# Patient Record
Sex: Female | Born: 1945 | Race: White | Hispanic: No | Marital: Married | Smoking: Never smoker
Health system: Southern US, Community
[De-identification: ages and names within clinical notes are randomized; demographics above are authoritative.]

## PROBLEM LIST (undated history)

## (undated) DIAGNOSIS — E782 Mixed hyperlipidemia: Secondary | ICD-10-CM

## (undated) DIAGNOSIS — Z9889 Other specified postprocedural states: Secondary | ICD-10-CM

## (undated) DIAGNOSIS — E538 Deficiency of other specified B group vitamins: Secondary | ICD-10-CM

## (undated) DIAGNOSIS — K6389 Other specified diseases of intestine: Secondary | ICD-10-CM

## (undated) DIAGNOSIS — C50919 Malignant neoplasm of unspecified site of unspecified female breast: Secondary | ICD-10-CM

## (undated) DIAGNOSIS — Z923 Personal history of irradiation: Secondary | ICD-10-CM

## (undated) DIAGNOSIS — R911 Solitary pulmonary nodule: Secondary | ICD-10-CM

## (undated) DIAGNOSIS — C50912 Malignant neoplasm of unspecified site of left female breast: Secondary | ICD-10-CM

## (undated) DIAGNOSIS — E039 Hypothyroidism, unspecified: Secondary | ICD-10-CM

## (undated) HISTORY — DX: Malignant neoplasm of unspecified site of left female breast: C50.912

## (undated) HISTORY — PX: WISDOM TOOTH EXTRACTION: SHX21

## (undated) HISTORY — PX: TUBAL LIGATION: SHX77

## (undated) HISTORY — PX: MASTECTOMY: SHX3

---

## 2004-06-09 ENCOUNTER — Ambulatory Visit: Payer: Self-pay | Admitting: Internal Medicine

## 2005-07-06 ENCOUNTER — Ambulatory Visit: Payer: Self-pay | Admitting: Internal Medicine

## 2005-08-22 ENCOUNTER — Ambulatory Visit: Payer: Self-pay | Admitting: Gastroenterology

## 2006-07-10 ENCOUNTER — Ambulatory Visit: Payer: Self-pay | Admitting: Internal Medicine

## 2007-07-16 ENCOUNTER — Ambulatory Visit: Payer: Self-pay | Admitting: Internal Medicine

## 2008-07-23 ENCOUNTER — Ambulatory Visit: Payer: Self-pay | Admitting: Internal Medicine

## 2009-08-11 ENCOUNTER — Ambulatory Visit: Payer: Self-pay | Admitting: Internal Medicine

## 2010-09-28 ENCOUNTER — Ambulatory Visit: Payer: Self-pay | Admitting: Internal Medicine

## 2010-10-11 ENCOUNTER — Ambulatory Visit: Payer: Self-pay | Admitting: Internal Medicine

## 2011-02-07 DIAGNOSIS — Z9221 Personal history of antineoplastic chemotherapy: Secondary | ICD-10-CM

## 2011-02-07 DIAGNOSIS — C50919 Malignant neoplasm of unspecified site of unspecified female breast: Secondary | ICD-10-CM

## 2011-02-07 DIAGNOSIS — Z923 Personal history of irradiation: Secondary | ICD-10-CM

## 2011-02-07 HISTORY — DX: Personal history of irradiation: Z92.3

## 2011-02-07 HISTORY — PX: BREAST EXCISIONAL BIOPSY: SUR124

## 2011-02-07 HISTORY — PX: BREAST LUMPECTOMY: SHX2

## 2011-02-07 HISTORY — DX: Personal history of antineoplastic chemotherapy: Z92.21

## 2011-02-07 HISTORY — DX: Malignant neoplasm of unspecified site of unspecified female breast: C50.919

## 2011-04-13 ENCOUNTER — Ambulatory Visit: Payer: Self-pay | Admitting: Internal Medicine

## 2011-05-02 ENCOUNTER — Ambulatory Visit: Payer: Self-pay | Admitting: Surgery

## 2011-05-10 ENCOUNTER — Ambulatory Visit: Payer: Self-pay | Admitting: Surgery

## 2011-05-16 LAB — PATHOLOGY REPORT

## 2011-05-24 ENCOUNTER — Ambulatory Visit: Payer: Self-pay | Admitting: Oncology

## 2011-06-07 ENCOUNTER — Ambulatory Visit: Payer: Self-pay | Admitting: Oncology

## 2011-06-21 ENCOUNTER — Ambulatory Visit: Payer: Self-pay | Admitting: Surgery

## 2011-06-29 LAB — CBC CANCER CENTER
Basophil #: 0 x10 3/mm (ref 0.0–0.1)
Eosinophil #: 0.1 x10 3/mm (ref 0.0–0.7)
HCT: 37.7 % (ref 35.0–47.0)
HGB: 12.7 g/dL (ref 12.0–16.0)
Lymphocyte #: 1.3 x10 3/mm (ref 1.0–3.6)
MCHC: 33.5 g/dL (ref 32.0–36.0)
MCV: 90 fL (ref 80–100)
Monocyte #: 0.5 x10 3/mm (ref 0.2–0.9)
Monocyte %: 11.4 %
Neutrophil #: 2.8 x10 3/mm (ref 1.4–6.5)
Neutrophil %: 57.9 %
Platelet: 174 x10 3/mm (ref 150–440)
RBC: 4.2 10*6/uL (ref 3.80–5.20)
RDW: 12.8 % (ref 11.5–14.5)
WBC: 4.8 x10 3/mm (ref 3.6–11.0)

## 2011-06-29 LAB — COMPREHENSIVE METABOLIC PANEL
Albumin: 3.7 g/dL (ref 3.4–5.0)
Alkaline Phosphatase: 92 U/L (ref 50–136)
Chloride: 104 mmol/L (ref 98–107)
Creatinine: 0.9 mg/dL (ref 0.60–1.30)
Potassium: 4.2 mmol/L (ref 3.5–5.1)
SGPT (ALT): 35 U/L
Sodium: 142 mmol/L (ref 136–145)
Total Protein: 6.9 g/dL (ref 6.4–8.2)

## 2011-06-30 LAB — CBC WITH DIFFERENTIAL/PLATELET
Basophil %: 0.3 %
Eosinophil #: 0 10*3/uL (ref 0.0–0.7)
HCT: 34.1 % — ABNORMAL LOW (ref 35.0–47.0)
Lymphocyte #: 0.3 10*3/uL — ABNORMAL LOW (ref 1.0–3.6)
Lymphocyte %: 5.3 %
MCH: 30.5 pg (ref 26.0–34.0)
MCV: 89 fL (ref 80–100)
Monocyte %: 1.4 %
Neutrophil #: 5 10*3/uL (ref 1.4–6.5)
Neutrophil %: 92.7 %
RBC: 3.83 10*6/uL (ref 3.80–5.20)
RDW: 12.5 % (ref 11.5–14.5)
WBC: 5.4 10*3/uL (ref 3.6–11.0)

## 2011-07-01 ENCOUNTER — Inpatient Hospital Stay: Payer: Self-pay | Admitting: Internal Medicine

## 2011-07-01 LAB — COMPREHENSIVE METABOLIC PANEL
Alkaline Phosphatase: 84 U/L (ref 50–136)
Bilirubin,Total: 0.7 mg/dL (ref 0.2–1.0)
Calcium, Total: 7.8 mg/dL — ABNORMAL LOW (ref 8.5–10.1)
Chloride: 105 mmol/L (ref 98–107)
Creatinine: 1.03 mg/dL (ref 0.60–1.30)
EGFR (African American): >60
EGFR (Non-African Amer.): 57 — ABNORMAL LOW
Osmolality: 281 (ref 275–301)
Potassium: 3.8 mmol/L (ref 3.5–5.1)
Sodium: 140 mmol/L (ref 136–145)
Total Protein: 6.3 g/dL — ABNORMAL LOW (ref 6.4–8.2)

## 2011-07-01 LAB — URINALYSIS, COMPLETE
Bacteria: NONE SEEN
Blood: NEGATIVE
Glucose,UR: NEGATIVE mg/dL (ref 0–75)
Ketone: NEGATIVE
Ph: 5 (ref 4.5–8.0)
Protein: NEGATIVE
RBC,UR: 1 /HPF (ref 0–5)
Squamous Epithelial: <1

## 2011-07-01 LAB — TROPONIN I: Troponin-I: <0.02 ng/mL

## 2011-07-02 LAB — COMPREHENSIVE METABOLIC PANEL
Albumin: 2.9 g/dL — ABNORMAL LOW (ref 3.4–5.0)
Anion Gap: 8 (ref 7–16)
Bilirubin,Total: 1.4 mg/dL — ABNORMAL HIGH (ref 0.2–1.0)
Co2: 27 mmol/L (ref 21–32)
Creatinine: 0.96 mg/dL (ref 0.60–1.30)
EGFR (African American): >60
EGFR (Non-African Amer.): >60
Glucose: 121 mg/dL — ABNORMAL HIGH (ref 65–99)

## 2011-07-02 LAB — PROTIME-INR: INR: 1

## 2011-07-02 LAB — CBC WITH DIFFERENTIAL/PLATELET
Basophil #: 0 10*3/uL (ref 0.0–0.1)
Eosinophil %: 2.3 %
HCT: 34 % — ABNORMAL LOW (ref 35.0–47.0)
Lymphocyte #: 0.3 10*3/uL — ABNORMAL LOW (ref 1.0–3.6)
Lymphocyte %: 10.3 %
MCHC: 34 g/dL (ref 32.0–36.0)
Monocyte #: 0 x10 3/mm — ABNORMAL LOW (ref 0.2–0.9)
Neutrophil %: 86.3 %
Platelet: 81 10*3/uL — ABNORMAL LOW (ref 150–440)

## 2011-07-03 LAB — CBC WITH DIFFERENTIAL/PLATELET
Basophil #: 0 10*3/uL (ref 0.0–0.1)
Basophil %: 0.9 %
Eosinophil #: 0.1 10*3/uL (ref 0.0–0.7)
Eosinophil %: 2.6 %
HGB: 10.8 g/dL — ABNORMAL LOW (ref 12.0–16.0)
Lymphocyte %: 13.8 %
MCH: 30.4 pg (ref 26.0–34.0)
MCHC: 34.3 g/dL (ref 32.0–36.0)
Monocyte #: 0 x10 3/mm — ABNORMAL LOW (ref 0.2–0.9)
Neutrophil #: 1.8 10*3/uL (ref 1.4–6.5)
Platelet: 57 10*3/uL — ABNORMAL LOW (ref 150–440)
WBC: 2.2 10*3/uL — ABNORMAL LOW (ref 3.6–11.0)

## 2011-07-03 LAB — COMPREHENSIVE METABOLIC PANEL
Albumin: 2.6 g/dL — ABNORMAL LOW (ref 3.4–5.0)
Alkaline Phosphatase: 105 U/L (ref 50–136)
BUN: 9 mg/dL (ref 7–18)
Calcium, Total: 7.5 mg/dL — ABNORMAL LOW (ref 8.5–10.1)
Chloride: 106 mmol/L (ref 98–107)
Creatinine: 0.85 mg/dL (ref 0.60–1.30)
EGFR (African American): >60
EGFR (Non-African Amer.): >60
Potassium: 3.7 mmol/L (ref 3.5–5.1)
Sodium: 139 mmol/L (ref 136–145)

## 2011-07-04 LAB — CBC WITH DIFFERENTIAL/PLATELET
Basophil #: 0 10*3/uL (ref 0.0–0.1)
Eosinophil #: 0 10*3/uL (ref 0.0–0.7)
Eosinophil %: 1 %
HCT: 32.6 % — ABNORMAL LOW (ref 35.0–47.0)
HGB: 11.3 g/dL — ABNORMAL LOW (ref 12.0–16.0)
Lymphocyte #: 0.2 10*3/uL — ABNORMAL LOW (ref 1.0–3.6)
Lymphocyte %: 12.8 %
MCH: 30.2 pg (ref 26.0–34.0)
Monocyte #: 0 x10 3/mm — ABNORMAL LOW (ref 0.2–0.9)
Monocyte %: 1.5 %
Neutrophil %: 83.5 %
Platelet: 60 10*3/uL — ABNORMAL LOW (ref 150–440)
RDW: 12.4 % (ref 11.5–14.5)

## 2011-07-04 LAB — COMPREHENSIVE METABOLIC PANEL
BUN: 11 mg/dL (ref 7–18)
Bilirubin,Total: 0.9 mg/dL (ref 0.2–1.0)
Chloride: 99 mmol/L (ref 98–107)
EGFR (Non-African Amer.): >60
Glucose: 101 mg/dL — ABNORMAL HIGH (ref 65–99)
Osmolality: 270 (ref 275–301)
Potassium: 3.3 mmol/L — ABNORMAL LOW (ref 3.5–5.1)
Sodium: 135 mmol/L — ABNORMAL LOW (ref 136–145)
Total Protein: 6.1 g/dL — ABNORMAL LOW (ref 6.4–8.2)

## 2011-07-05 LAB — CBC WITH DIFFERENTIAL/PLATELET
Basophil #: 0 10*3/uL (ref 0.0–0.1)
HCT: 32.7 % — ABNORMAL LOW (ref 35.0–47.0)
HGB: 11.3 g/dL — ABNORMAL LOW (ref 12.0–16.0)
Lymphocyte #: 0.3 10*3/uL — ABNORMAL LOW (ref 1.0–3.6)
MCH: 30 pg (ref 26.0–34.0)
MCHC: 34.5 g/dL (ref 32.0–36.0)
Neutrophil #: 0.6 10*3/uL — ABNORMAL LOW (ref 1.4–6.5)
Neutrophil %: 59.9 %
Platelet: 65 10*3/uL — ABNORMAL LOW (ref 150–440)
RBC: 3.76 10*6/uL — ABNORMAL LOW (ref 3.80–5.20)
WBC: 1 10*3/uL — CL (ref 3.6–11.0)

## 2011-07-05 LAB — COMPREHENSIVE METABOLIC PANEL
Albumin: 2.7 g/dL — ABNORMAL LOW (ref 3.4–5.0)
Co2: 28 mmol/L (ref 21–32)
EGFR (African American): >60
EGFR (Non-African Amer.): >60
SGOT(AST): 111 U/L — ABNORMAL HIGH (ref 15–37)
Total Protein: 6.2 g/dL — ABNORMAL LOW (ref 6.4–8.2)

## 2011-07-06 LAB — COMPREHENSIVE METABOLIC PANEL
Albumin: 2.9 g/dL — ABNORMAL LOW (ref 3.4–5.0)
Anion Gap: 12 (ref 7–16)
BUN: 13 mg/dL (ref 7–18)
Bilirubin,Total: 0.9 mg/dL (ref 0.2–1.0)
Calcium, Total: 8.5 mg/dL (ref 8.5–10.1)
Chloride: 98 mmol/L (ref 98–107)
Co2: 27 mmol/L (ref 21–32)
Creatinine: 0.78 mg/dL (ref 0.60–1.30)
EGFR (Non-African Amer.): >60
Potassium: 3.5 mmol/L (ref 3.5–5.1)
SGOT(AST): 62 U/L — ABNORMAL HIGH (ref 15–37)
SGPT (ALT): 119 U/L — ABNORMAL HIGH

## 2011-07-06 LAB — CULTURE, BLOOD (SINGLE)

## 2011-07-06 LAB — CBC WITH DIFFERENTIAL/PLATELET
Eosinophil #: 0 10*3/uL (ref 0.0–0.7)
Eosinophil %: 1.1 %
HGB: 11.3 g/dL — ABNORMAL LOW (ref 12.0–16.0)
MCH: 30.3 pg (ref 26.0–34.0)
Monocyte #: 0.5 x10 3/mm (ref 0.2–0.9)
Neutrophil #: 0.7 10*3/uL — ABNORMAL LOW (ref 1.4–6.5)
WBC: 1.7 10*3/uL — CL (ref 3.6–11.0)

## 2011-07-06 LAB — IRON AND TIBC: Iron Saturation: 38 %

## 2011-07-06 LAB — FERRITIN: Ferritin (ARMC): 1035 ng/mL — ABNORMAL HIGH (ref 8–388)

## 2011-07-07 LAB — CBC WITH DIFFERENTIAL/PLATELET
HGB: 10.9 g/dL — ABNORMAL LOW (ref 12.0–16.0)
Lymphocytes: 15 %
MCHC: 34.7 g/dL (ref 32.0–36.0)
Metamyelocyte: 10 %
Monocytes: 11 %
Myelocyte: 14 %
Platelet: 112 10*3/uL — ABNORMAL LOW (ref 150–440)
RBC: 3.57 10*6/uL — ABNORMAL LOW (ref 3.80–5.20)
RDW: 12.4 % (ref 11.5–14.5)
Segmented Neutrophils: 13 %
WBC: 9.8 10*3/uL (ref 3.6–11.0)

## 2011-07-07 LAB — COMPREHENSIVE METABOLIC PANEL
Albumin: 2.9 g/dL — ABNORMAL LOW (ref 3.4–5.0)
Alkaline Phosphatase: 184 U/L — ABNORMAL HIGH (ref 50–136)
Anion Gap: 11 (ref 7–16)
BUN: 14 mg/dL (ref 7–18)
Bilirubin,Total: 0.7 mg/dL (ref 0.2–1.0)
Chloride: 101 mmol/L (ref 98–107)
Creatinine: 0.88 mg/dL (ref 0.60–1.30)
EGFR (African American): >60
EGFR (Non-African Amer.): >60
Glucose: 103 mg/dL — ABNORMAL HIGH (ref 65–99)
Potassium: 4.2 mmol/L (ref 3.5–5.1)
SGOT(AST): 43 U/L — ABNORMAL HIGH (ref 15–37)
Sodium: 139 mmol/L (ref 136–145)

## 2011-07-07 LAB — CULTURE, BLOOD (SINGLE)

## 2011-07-08 ENCOUNTER — Ambulatory Visit: Payer: Self-pay | Admitting: Oncology

## 2011-07-08 LAB — CULTURE, BLOOD (SINGLE)

## 2011-07-10 LAB — COMPREHENSIVE METABOLIC PANEL
Albumin: 3.3 g/dL — ABNORMAL LOW (ref 3.4–5.0)
Alkaline Phosphatase: 146 U/L — ABNORMAL HIGH (ref 50–136)
Anion Gap: 9 (ref 7–16)
BUN: 23 mg/dL — ABNORMAL HIGH (ref 7–18)
Calcium, Total: 8.9 mg/dL (ref 8.5–10.1)
Chloride: 100 mmol/L (ref 98–107)
Co2: 27 mmol/L (ref 21–32)
EGFR (African American): >60
EGFR (Non-African Amer.): 57 — ABNORMAL LOW
Osmolality: 278 (ref 275–301)
Potassium: 4.2 mmol/L (ref 3.5–5.1)
SGOT(AST): 31 U/L (ref 15–37)
SGPT (ALT): 69 U/L
Sodium: 136 mmol/L (ref 136–145)
Total Protein: 6.6 g/dL (ref 6.4–8.2)

## 2011-07-10 LAB — CBC CANCER CENTER
Eosinophil #: 0 x10 3/mm (ref 0.0–0.7)
Eosinophil %: 0 %
HGB: 11.3 g/dL — ABNORMAL LOW (ref 12.0–16.0)
Lymphocyte #: 1.6 x10 3/mm (ref 1.0–3.6)
MCHC: 33.1 g/dL (ref 32.0–36.0)
Monocyte %: 3.7 %
Neutrophil #: 15.6 x10 3/mm — ABNORMAL HIGH (ref 1.4–6.5)
Neutrophil %: 86.9 %
Platelet: 199 x10 3/mm (ref 150–440)
RBC: 3.8 10*6/uL (ref 3.80–5.20)
RDW: 12.8 % (ref 11.5–14.5)
WBC: 18 x10 3/mm — ABNORMAL HIGH (ref 3.6–11.0)

## 2011-07-11 LAB — CULTURE, BLOOD (SINGLE)

## 2011-07-20 LAB — CBC CANCER CENTER
Eosinophil #: 0 x10 3/mm (ref 0.0–0.7)
HGB: 10.7 g/dL — ABNORMAL LOW (ref 12.0–16.0)
MCV: 92 fL (ref 80–100)
Neutrophil %: 61.6 %
Platelet: 189 x10 3/mm (ref 150–440)
RDW: 13.4 % (ref 11.5–14.5)

## 2011-07-20 LAB — COMPREHENSIVE METABOLIC PANEL
Alkaline Phosphatase: 99 U/L (ref 50–136)
BUN: 14 mg/dL (ref 7–18)
Bilirubin,Total: 0.5 mg/dL (ref 0.2–1.0)
Calcium, Total: 8.3 mg/dL — ABNORMAL LOW (ref 8.5–10.1)
Chloride: 107 mmol/L (ref 98–107)
Co2: 28 mmol/L (ref 21–32)
Creatinine: 0.89 mg/dL (ref 0.60–1.30)
EGFR (African American): >60
EGFR (Non-African Amer.): >60
Glucose: 108 mg/dL — ABNORMAL HIGH (ref 65–99)
Osmolality: 282 (ref 275–301)
Potassium: 4 mmol/L (ref 3.5–5.1)
SGPT (ALT): 46 U/L
Sodium: 141 mmol/L (ref 136–145)

## 2011-07-27 LAB — CBC CANCER CENTER
Eosinophil %: 8.8 %
HCT: 30.2 % — ABNORMAL LOW (ref 35.0–47.0)
Lymphocyte #: 0.5 x10 3/mm — ABNORMAL LOW (ref 1.0–3.6)
Lymphocyte %: 85.1 %
MCH: 31.2 pg (ref 26.0–34.0)
MCHC: 33.7 g/dL (ref 32.0–36.0)
MCV: 93 fL (ref 80–100)
Monocyte %: 3.6 %
Neutrophil #: 0 x10 3/mm — ABNORMAL LOW (ref 1.4–6.5)
Neutrophil %: 1.2 %
Platelet: 72 x10 3/mm — ABNORMAL LOW (ref 150–440)
RBC: 3.26 10*6/uL — ABNORMAL LOW (ref 3.80–5.20)
RDW: 13.3 % (ref 11.5–14.5)
WBC: 0.5 x10 3/mm — CL (ref 3.6–11.0)

## 2011-08-03 LAB — CBC CANCER CENTER
Basophil #: 0 x10 3/mm (ref 0.0–0.1)
Eosinophil #: 0 x10 3/mm (ref 0.0–0.7)
Lymphocyte #: 1.9 x10 3/mm (ref 1.0–3.6)
Lymphocyte %: 14.6 %
MCH: 30.4 pg (ref 26.0–34.0)
MCHC: 33.1 g/dL (ref 32.0–36.0)
Monocyte #: 1.1 x10 3/mm — ABNORMAL HIGH (ref 0.2–0.9)
Platelet: 292 x10 3/mm (ref 150–440)
RDW: 13.8 % (ref 11.5–14.5)
WBC: 12.8 x10 3/mm — ABNORMAL HIGH (ref 3.6–11.0)

## 2011-08-07 ENCOUNTER — Ambulatory Visit: Payer: Self-pay | Admitting: Oncology

## 2011-08-09 LAB — CBC CANCER CENTER
Basophil %: 0.2 %
Eosinophil %: 0.1 %
HCT: 29.9 % — ABNORMAL LOW (ref 35.0–47.0)
HGB: 10 g/dL — ABNORMAL LOW (ref 12.0–16.0)
Lymphocyte #: 1.5 x10 3/mm (ref 1.0–3.6)
Lymphocyte %: 18.8 %
MCHC: 33.6 g/dL (ref 32.0–36.0)
Monocyte %: 12.3 %
Neutrophil #: 5.5 x10 3/mm (ref 1.4–6.5)
Platelet: 407 x10 3/mm (ref 150–440)
RDW: 14.5 % (ref 11.5–14.5)
WBC: 8.1 x10 3/mm (ref 3.6–11.0)

## 2011-08-09 LAB — BASIC METABOLIC PANEL
BUN: 13 mg/dL (ref 7–18)
Co2: 27 mmol/L (ref 21–32)
EGFR (Non-African Amer.): >60
Glucose: 125 mg/dL — ABNORMAL HIGH (ref 65–99)
Osmolality: 285 (ref 275–301)
Potassium: 3.9 mmol/L (ref 3.5–5.1)
Sodium: 142 mmol/L (ref 136–145)

## 2011-08-16 LAB — CBC CANCER CENTER
Basophil #: 0 x10 3/mm (ref 0.0–0.1)
Eosinophil #: 0 x10 3/mm (ref 0.0–0.7)
Eosinophil %: 0.4 %
Lymphocyte #: 0.5 x10 3/mm — ABNORMAL LOW (ref 1.0–3.6)
Lymphocyte %: 63.2 %
MCV: 93 fL (ref 80–100)
Monocyte %: 2.8 %
Neutrophil %: 31.9 %
Platelet: 207 x10 3/mm (ref 150–440)
RBC: 3.35 10*6/uL — ABNORMAL LOW (ref 3.80–5.20)
RDW: 15.1 % — ABNORMAL HIGH (ref 11.5–14.5)
WBC: 0.8 x10 3/mm — CL (ref 3.6–11.0)

## 2011-08-23 LAB — CBC CANCER CENTER
Basophil #: 0 x10 3/mm (ref 0.0–0.1)
Basophil %: 0.2 %
Eosinophil #: 0 x10 3/mm (ref 0.0–0.7)
Eosinophil %: 0.2 %
HGB: 9.6 g/dL — ABNORMAL LOW (ref 12.0–16.0)
Lymphocyte %: 13.1 %
MCH: 30.4 pg (ref 26.0–34.0)
MCV: 92 fL (ref 80–100)
Monocyte #: 1.2 x10 3/mm — ABNORMAL HIGH (ref 0.2–0.9)
Neutrophil %: 77.1 %
Platelet: 108 x10 3/mm — ABNORMAL LOW (ref 150–440)
RBC: 3.14 10*6/uL — ABNORMAL LOW (ref 3.80–5.20)

## 2011-08-30 LAB — CBC CANCER CENTER
Basophil #: 0 x10 3/mm (ref 0.0–0.1)
Basophil %: 0.5 %
Eosinophil %: 0.2 %
HGB: 9.9 g/dL — ABNORMAL LOW (ref 12.0–16.0)
Lymphocyte #: 0.9 x10 3/mm — ABNORMAL LOW (ref 1.0–3.6)
Lymphocyte %: 16.5 %
Monocyte #: 0.8 x10 3/mm (ref 0.2–0.9)
Monocyte %: 14.3 %
Neutrophil %: 68.5 %
RBC: 3.15 10*6/uL — ABNORMAL LOW (ref 3.80–5.20)
WBC: 5.7 x10 3/mm (ref 3.6–11.0)

## 2011-08-30 LAB — COMPREHENSIVE METABOLIC PANEL
Alkaline Phosphatase: 86 U/L (ref 50–136)
Anion Gap: 7 (ref 7–16)
Calcium, Total: 9 mg/dL (ref 8.5–10.1)
Chloride: 106 mmol/L (ref 98–107)
Co2: 28 mmol/L (ref 21–32)
Creatinine: 0.73 mg/dL (ref 0.60–1.30)
EGFR (African American): >60
Glucose: 112 mg/dL — ABNORMAL HIGH (ref 65–99)
Osmolality: 282 (ref 275–301)
SGOT(AST): 14 U/L — ABNORMAL LOW (ref 15–37)
SGPT (ALT): 21 U/L

## 2011-09-06 LAB — CBC CANCER CENTER
Basophil #: 0 x10 3/mm (ref 0.0–0.1)
Basophil %: 1 %
Eosinophil #: 0 x10 3/mm (ref 0.0–0.7)
Eosinophil %: 0.3 %
HCT: 29 % — ABNORMAL LOW (ref 35.0–47.0)
HGB: 9.8 g/dL — ABNORMAL LOW (ref 12.0–16.0)
Lymphocyte #: 0.4 x10 3/mm — ABNORMAL LOW (ref 1.0–3.6)
Lymphocyte %: 29.7 %
MCV: 95 fL (ref 80–100)
Monocyte %: 3.4 %
Neutrophil #: 0.9 x10 3/mm — ABNORMAL LOW (ref 1.4–6.5)
RBC: 3.03 10*6/uL — ABNORMAL LOW (ref 3.80–5.20)
WBC: 1.4 x10 3/mm — CL (ref 3.6–11.0)

## 2011-09-07 ENCOUNTER — Ambulatory Visit: Payer: Self-pay | Admitting: Oncology

## 2011-09-13 LAB — CBC CANCER CENTER
Basophil #: 0 x10 3/mm (ref 0.0–0.1)
Eosinophil #: 0 x10 3/mm (ref 0.0–0.7)
Eosinophil %: 0.7 %
Lymphocyte #: 1.2 x10 3/mm (ref 1.0–3.6)
Lymphocyte %: 19.7 %
MCH: 33.4 pg (ref 26.0–34.0)
MCHC: 35.1 g/dL (ref 32.0–36.0)
MCV: 95 fL (ref 80–100)
Monocyte #: 0.9 x10 3/mm (ref 0.2–0.9)
Neutrophil %: 63.7 %
Platelet: 112 x10 3/mm — ABNORMAL LOW (ref 150–440)
RBC: 2.85 10*6/uL — ABNORMAL LOW (ref 3.80–5.20)
RDW: 17 % — ABNORMAL HIGH (ref 11.5–14.5)
WBC: 6 x10 3/mm (ref 3.6–11.0)

## 2011-09-20 LAB — CBC CANCER CENTER
Basophil %: 0.6 %
HCT: 26.3 % — ABNORMAL LOW (ref 35.0–47.0)
Lymphocyte #: 0.8 x10 3/mm — ABNORMAL LOW (ref 1.0–3.6)
MCHC: 35.5 g/dL (ref 32.0–36.0)
MCV: 96 fL (ref 80–100)
Monocyte #: 0.6 x10 3/mm (ref 0.2–0.9)
Monocyte %: 15.4 %
Neutrophil #: 2.4 x10 3/mm (ref 1.4–6.5)
RDW: 18.9 % — ABNORMAL HIGH (ref 11.5–14.5)

## 2011-10-04 LAB — CBC CANCER CENTER
Basophil #: 0 x10 3/mm (ref 0.0–0.1)
Basophil %: 0.4 %
HCT: 30.5 % — ABNORMAL LOW (ref 35.0–47.0)
Lymphocyte #: 0.7 x10 3/mm — ABNORMAL LOW (ref 1.0–3.6)
Lymphocyte %: 21.4 %
MCH: 33.3 pg (ref 26.0–34.0)
MCV: 100 fL (ref 80–100)
Monocyte #: 0.6 x10 3/mm (ref 0.2–0.9)
Monocyte %: 18 %
Neutrophil #: 1.9 x10 3/mm (ref 1.4–6.5)
RBC: 3.06 10*6/uL — ABNORMAL LOW (ref 3.80–5.20)
RDW: 17 % — ABNORMAL HIGH (ref 11.5–14.5)

## 2011-10-08 ENCOUNTER — Ambulatory Visit: Payer: Self-pay | Admitting: Oncology

## 2011-10-11 LAB — CBC CANCER CENTER
Basophil %: 0.6 %
Eosinophil #: 0.2 x10 3/mm (ref 0.0–0.7)
Eosinophil %: 4.7 %
HCT: 31.6 % — ABNORMAL LOW (ref 35.0–47.0)
HGB: 10.6 g/dL — ABNORMAL LOW (ref 12.0–16.0)
Lymphocyte #: 0.7 x10 3/mm — ABNORMAL LOW (ref 1.0–3.6)
MCH: 33.8 pg (ref 26.0–34.0)
MCV: 101 fL — ABNORMAL HIGH (ref 80–100)
Monocyte #: 0.5 x10 3/mm (ref 0.2–0.9)
Neutrophil #: 2 x10 3/mm (ref 1.4–6.5)
RBC: 3.14 10*6/uL — ABNORMAL LOW (ref 3.80–5.20)

## 2011-10-18 LAB — CBC CANCER CENTER
Basophil #: 0 x10 3/mm (ref 0.0–0.1)
Eosinophil %: 5.2 %
HGB: 11 g/dL — ABNORMAL LOW (ref 12.0–16.0)
Lymphocyte %: 16.3 %
MCV: 101 fL — ABNORMAL HIGH (ref 80–100)
Monocyte %: 12.6 %
Neutrophil %: 65.4 %
Platelet: 151 x10 3/mm (ref 150–440)
RBC: 3.32 10*6/uL — ABNORMAL LOW (ref 3.80–5.20)
RDW: 14.7 % — ABNORMAL HIGH (ref 11.5–14.5)

## 2011-10-25 LAB — CBC CANCER CENTER
Basophil #: 0 x10 3/mm (ref 0.0–0.1)
Eosinophil #: 0.2 x10 3/mm (ref 0.0–0.7)
HCT: 34.1 % — ABNORMAL LOW (ref 35.0–47.0)
HGB: 11.4 g/dL — ABNORMAL LOW (ref 12.0–16.0)
Lymphocyte #: 0.8 x10 3/mm — ABNORMAL LOW (ref 1.0–3.6)
Lymphocyte %: 21 %
MCH: 33.4 pg (ref 26.0–34.0)
MCHC: 33.3 g/dL (ref 32.0–36.0)
MCV: 100 fL (ref 80–100)
Monocyte #: 0.5 x10 3/mm (ref 0.2–0.9)
Platelet: 162 x10 3/mm (ref 150–440)
RBC: 3.4 10*6/uL — ABNORMAL LOW (ref 3.80–5.20)

## 2011-11-01 LAB — CBC CANCER CENTER
Basophil #: 0 x10 3/mm (ref 0.0–0.1)
Eosinophil #: 0.2 x10 3/mm (ref 0.0–0.7)
Eosinophil %: 6.7 %
HCT: 33 % — ABNORMAL LOW (ref 35.0–47.0)
HGB: 11.1 g/dL — ABNORMAL LOW (ref 12.0–16.0)
Lymphocyte #: 0.7 x10 3/mm — ABNORMAL LOW (ref 1.0–3.6)
MCHC: 33.7 g/dL (ref 32.0–36.0)
MCV: 100 fL (ref 80–100)
Monocyte #: 0.5 x10 3/mm (ref 0.2–0.9)
Monocyte %: 14.7 %
Neutrophil #: 2.1 x10 3/mm (ref 1.4–6.5)
Neutrophil %: 58.3 %
RBC: 3.3 10*6/uL — ABNORMAL LOW (ref 3.80–5.20)
WBC: 3.6 x10 3/mm (ref 3.6–11.0)

## 2011-11-07 ENCOUNTER — Ambulatory Visit: Payer: Self-pay | Admitting: Oncology

## 2011-11-08 LAB — CBC CANCER CENTER
Basophil #: 0 x10 3/mm (ref 0.0–0.1)
Eosinophil #: 0.2 x10 3/mm (ref 0.0–0.7)
Eosinophil %: 6.9 %
HCT: 33.3 % — ABNORMAL LOW (ref 35.0–47.0)
HGB: 11.2 g/dL — ABNORMAL LOW (ref 12.0–16.0)
Lymphocyte #: 0.6 x10 3/mm — ABNORMAL LOW (ref 1.0–3.6)
MCH: 33.8 pg (ref 26.0–34.0)
MCHC: 33.7 g/dL (ref 32.0–36.0)
MCV: 100 fL (ref 80–100)
Monocyte #: 0.5 x10 3/mm (ref 0.2–0.9)
Neutrophil #: 1.9 x10 3/mm (ref 1.4–6.5)
Platelet: 134 x10 3/mm — ABNORMAL LOW (ref 150–440)
RBC: 3.32 10*6/uL — ABNORMAL LOW (ref 3.80–5.20)
RDW: 12.7 % (ref 11.5–14.5)
WBC: 3.2 x10 3/mm — ABNORMAL LOW (ref 3.6–11.0)

## 2011-11-15 LAB — CBC CANCER CENTER
Basophil #: 0 x10 3/mm (ref 0.0–0.1)
Eosinophil #: 0.3 x10 3/mm (ref 0.0–0.7)
Lymphocyte #: 0.7 x10 3/mm — ABNORMAL LOW (ref 1.0–3.6)
MCH: 33.5 pg (ref 26.0–34.0)
MCHC: 33.8 g/dL (ref 32.0–36.0)
MCV: 99 fL (ref 80–100)
Monocyte #: 0.5 x10 3/mm (ref 0.2–0.9)
Monocyte %: 14.2 %
Neutrophil %: 57.8 %
Platelet: 132 x10 3/mm — ABNORMAL LOW (ref 150–440)
RBC: 3.4 10*6/uL — ABNORMAL LOW (ref 3.80–5.20)
RDW: 12.4 % (ref 11.5–14.5)
WBC: 3.5 x10 3/mm — ABNORMAL LOW (ref 3.6–11.0)

## 2011-11-29 LAB — COMPREHENSIVE METABOLIC PANEL
Albumin: 3.5 g/dL (ref 3.4–5.0)
Anion Gap: 8 (ref 7–16)
BUN: 13 mg/dL (ref 7–18)
Bilirubin,Total: 0.3 mg/dL (ref 0.2–1.0)
Chloride: 106 mmol/L (ref 98–107)
Creatinine: 0.85 mg/dL (ref 0.60–1.30)
EGFR (Non-African Amer.): >60
Glucose: 119 mg/dL — ABNORMAL HIGH (ref 65–99)
Potassium: 3.8 mmol/L (ref 3.5–5.1)
SGOT(AST): 18 U/L (ref 15–37)
SGPT (ALT): 25 U/L (ref 12–78)
Sodium: 141 mmol/L (ref 136–145)
Total Protein: 6.6 g/dL (ref 6.4–8.2)

## 2011-11-29 LAB — CBC CANCER CENTER
Basophil #: 0 x10 3/mm (ref 0.0–0.1)
Eosinophil #: 0.2 x10 3/mm (ref 0.0–0.7)
Eosinophil %: 6.2 %
Lymphocyte #: 1 x10 3/mm (ref 1.0–3.6)
Lymphocyte %: 27 %
MCV: 99 fL (ref 80–100)
Monocyte #: 0.5 x10 3/mm (ref 0.2–0.9)
Monocyte %: 13.1 %
Neutrophil #: 2 x10 3/mm (ref 1.4–6.5)
Neutrophil %: 53.1 %
Platelet: 136 x10 3/mm — ABNORMAL LOW (ref 150–440)
RDW: 12.4 % (ref 11.5–14.5)
WBC: 3.8 x10 3/mm (ref 3.6–11.0)

## 2011-11-30 LAB — CANCER ANTIGEN 27.29: CA 27.29: 8.7 U/mL (ref 0.0–38.6)

## 2011-12-08 ENCOUNTER — Ambulatory Visit: Payer: Self-pay | Admitting: Oncology

## 2012-01-16 ENCOUNTER — Ambulatory Visit: Payer: Self-pay | Admitting: Oncology

## 2012-01-16 LAB — CBC CANCER CENTER
Eosinophil #: 0.1 x10 3/mm (ref 0.0–0.7)
Eosinophil %: 2.8 %
HGB: 12.1 g/dL (ref 12.0–16.0)
Lymphocyte #: 1.1 x10 3/mm (ref 1.0–3.6)
MCH: 34 pg (ref 26.0–34.0)
MCHC: 35.9 g/dL (ref 32.0–36.0)
MCV: 95 fL (ref 80–100)
Monocyte #: 0.5 x10 3/mm (ref 0.2–0.9)
Monocyte %: 11.3 %
Neutrophil #: 2.4 x10 3/mm (ref 1.4–6.5)
Neutrophil %: 57.3 %
Platelet: 143 x10 3/mm — ABNORMAL LOW (ref 150–440)
RBC: 3.54 10*6/uL — ABNORMAL LOW (ref 3.80–5.20)

## 2012-01-16 LAB — COMPREHENSIVE METABOLIC PANEL
Albumin: 3.5 g/dL (ref 3.4–5.0)
Alkaline Phosphatase: 103 U/L (ref 50–136)
Anion Gap: 9 (ref 7–16)
BUN: 14 mg/dL (ref 7–18)
EGFR (African American): >60
EGFR (Non-African Amer.): >60
Glucose: 97 mg/dL (ref 65–99)
Osmolality: 282 (ref 275–301)
Potassium: 4.1 mmol/L (ref 3.5–5.1)
SGOT(AST): 20 U/L (ref 15–37)

## 2012-01-17 LAB — CANCER ANTIGEN 27.29: CA 27.29: 9.7 U/mL (ref 0.0–38.6)

## 2012-02-07 ENCOUNTER — Ambulatory Visit: Payer: Self-pay | Admitting: Oncology

## 2012-05-07 ENCOUNTER — Ambulatory Visit: Payer: Self-pay | Admitting: Oncology

## 2012-05-14 LAB — COMPREHENSIVE METABOLIC PANEL
Albumin: 3.7 g/dL (ref 3.4–5.0)
BUN: 16 mg/dL (ref 7–18)
Bilirubin,Total: 0.5 mg/dL (ref 0.2–1.0)
Calcium, Total: 8.8 mg/dL (ref 8.5–10.1)
Chloride: 106 mmol/L (ref 98–107)
Creatinine: 1.04 mg/dL (ref 0.60–1.30)
EGFR (African American): >60
Glucose: 112 mg/dL — ABNORMAL HIGH (ref 65–99)
Osmolality: 287 (ref 275–301)
SGOT(AST): 26 U/L (ref 15–37)
SGPT (ALT): 33 U/L (ref 12–78)

## 2012-05-14 LAB — CBC CANCER CENTER
Basophil #: 0 x10 3/mm (ref 0.0–0.1)
Basophil %: 0.4 %
Eosinophil #: 0.1 x10 3/mm (ref 0.0–0.7)
Eosinophil %: 2 %
HCT: 35.3 % (ref 35.0–47.0)
HGB: 11.9 g/dL — ABNORMAL LOW (ref 12.0–16.0)
Lymphocyte #: 1.2 x10 3/mm (ref 1.0–3.6)
MCH: 32.6 pg (ref 26.0–34.0)
MCHC: 33.9 g/dL (ref 32.0–36.0)
Monocyte %: 10.1 %
Neutrophil %: 56.4 %
Platelet: 129 x10 3/mm — ABNORMAL LOW (ref 150–440)
RBC: 3.67 10*6/uL — ABNORMAL LOW (ref 3.80–5.20)
WBC: 3.9 x10 3/mm (ref 3.6–11.0)

## 2012-05-15 LAB — CANCER ANTIGEN 27.29: CA 27.29: 7.2 U/mL (ref 0.0–38.6)

## 2012-06-06 ENCOUNTER — Ambulatory Visit: Payer: Self-pay | Admitting: Oncology

## 2012-09-06 ENCOUNTER — Ambulatory Visit: Payer: Self-pay | Admitting: Oncology

## 2012-09-17 LAB — COMPREHENSIVE METABOLIC PANEL
Albumin: 3.7 g/dL (ref 3.4–5.0)
BUN: 14 mg/dL (ref 7–18)
Glucose: 95 mg/dL (ref 65–99)
Osmolality: 283 (ref 275–301)
Potassium: 4.4 mmol/L (ref 3.5–5.1)
SGOT(AST): 26 U/L (ref 15–37)

## 2012-09-17 LAB — CBC CANCER CENTER
Basophil #: 0 x10 3/mm (ref 0.0–0.1)
Basophil %: 0.4 %
Eosinophil #: 0.1 x10 3/mm (ref 0.0–0.7)
Eosinophil %: 2.1 %
HCT: 34 % — ABNORMAL LOW (ref 35.0–47.0)
HGB: 12.2 g/dL (ref 12.0–16.0)
Lymphocyte #: 2.9 x10 3/mm (ref 1.0–3.6)
Lymphocyte %: 56.5 %
MCHC: 35.8 g/dL (ref 32.0–36.0)
Monocyte #: 0.5 x10 3/mm (ref 0.2–0.9)
RBC: 3.6 10*6/uL — ABNORMAL LOW (ref 3.80–5.20)
RDW: 13.1 % (ref 11.5–14.5)
WBC: 5.2 x10 3/mm (ref 3.6–11.0)

## 2012-09-18 LAB — CANCER ANTIGEN 27.29: CA 27.29: 9.2 U/mL (ref 0.0–38.6)

## 2012-10-07 ENCOUNTER — Ambulatory Visit: Payer: Self-pay | Admitting: Oncology

## 2013-01-16 ENCOUNTER — Ambulatory Visit: Payer: Self-pay | Admitting: Oncology

## 2013-01-16 LAB — COMPREHENSIVE METABOLIC PANEL
Albumin: 3.6 g/dL (ref 3.4–5.0)
Alkaline Phosphatase: 92 U/L
Chloride: 106 mmol/L (ref 98–107)
Co2: 31 mmol/L (ref 21–32)
EGFR (African American): >60
Sodium: 142 mmol/L (ref 136–145)
Total Protein: 6.8 g/dL (ref 6.4–8.2)

## 2013-01-16 LAB — CBC CANCER CENTER
Basophil #: 0 x10 3/mm (ref 0.0–0.1)
Basophil %: 0.4 %
Eosinophil #: 0.1 x10 3/mm (ref 0.0–0.7)
HGB: 12.5 g/dL (ref 12.0–16.0)
Lymphocyte #: 1.9 x10 3/mm (ref 1.0–3.6)
Lymphocyte %: 39.4 %
MCH: 32.6 pg (ref 26.0–34.0)
MCHC: 33.9 g/dL (ref 32.0–36.0)
MCV: 96 fL (ref 80–100)
Monocyte #: 0.4 x10 3/mm (ref 0.2–0.9)
Neutrophil #: 2.4 x10 3/mm (ref 1.4–6.5)
RBC: 3.83 10*6/uL (ref 3.80–5.20)
RDW: 12.5 % (ref 11.5–14.5)
WBC: 4.8 x10 3/mm (ref 3.6–11.0)

## 2013-01-17 LAB — CANCER ANTIGEN 27.29: CA 27.29: 8.3 U/mL (ref 0.0–38.6)

## 2013-02-06 ENCOUNTER — Ambulatory Visit: Payer: Self-pay | Admitting: Oncology

## 2013-07-17 ENCOUNTER — Ambulatory Visit: Payer: Self-pay | Admitting: Oncology

## 2013-07-17 LAB — CBC CANCER CENTER
BASOS ABS: 0 x10 3/mm (ref 0.0–0.1)
Basophil %: 0.4 %
Eosinophil #: 0.1 x10 3/mm (ref 0.0–0.7)
Eosinophil %: 2.4 %
HCT: 35.6 % (ref 35.0–47.0)
HGB: 12.1 g/dL (ref 12.0–16.0)
LYMPHS ABS: 1.9 x10 3/mm (ref 1.0–3.6)
Lymphocyte %: 41.2 %
MCH: 32.7 pg (ref 26.0–34.0)
MCHC: 34 g/dL (ref 32.0–36.0)
MCV: 96 fL (ref 80–100)
MONO ABS: 0.5 x10 3/mm (ref 0.2–0.9)
Monocyte %: 10.2 %
NEUTROS ABS: 2.1 x10 3/mm (ref 1.4–6.5)
Neutrophil %: 45.8 %
PLATELETS: 148 x10 3/mm — AB (ref 150–440)
RBC: 3.7 10*6/uL — AB (ref 3.80–5.20)
RDW: 12.7 % (ref 11.5–14.5)
WBC: 4.6 x10 3/mm (ref 3.6–11.0)

## 2013-07-17 LAB — COMPREHENSIVE METABOLIC PANEL
ALBUMIN: 3.5 g/dL (ref 3.4–5.0)
ALT: 22 U/L (ref 12–78)
ANION GAP: 6 — AB (ref 7–16)
Alkaline Phosphatase: 96 U/L
BUN: 17 mg/dL (ref 7–18)
Bilirubin,Total: 0.6 mg/dL (ref 0.2–1.0)
CO2: 31 mmol/L (ref 21–32)
Calcium, Total: 9.1 mg/dL (ref 8.5–10.1)
Chloride: 107 mmol/L (ref 98–107)
Creatinine: 0.95 mg/dL (ref 0.60–1.30)
EGFR (African American): >60
EGFR (Non-African Amer.): >60
Glucose: 97 mg/dL (ref 65–99)
Osmolality: 288 (ref 275–301)
Potassium: 4.3 mmol/L (ref 3.5–5.1)
SGOT(AST): 20 U/L (ref 15–37)
Sodium: 144 mmol/L (ref 136–145)
Total Protein: 6.7 g/dL (ref 6.4–8.2)

## 2013-07-18 LAB — CANCER ANTIGEN 27.29: CA 27.29: 14.1 U/mL (ref 0.0–38.6)

## 2013-08-06 ENCOUNTER — Ambulatory Visit: Payer: Self-pay | Admitting: Oncology

## 2014-01-20 ENCOUNTER — Ambulatory Visit: Payer: Self-pay | Admitting: Oncology

## 2014-01-20 LAB — COMPREHENSIVE METABOLIC PANEL
ALBUMIN: 3.7 g/dL (ref 3.4–5.0)
ALT: 26 U/L
Alkaline Phosphatase: 90 U/L
Anion Gap: 4 — ABNORMAL LOW (ref 7–16)
BILIRUBIN TOTAL: 0.5 mg/dL (ref 0.2–1.0)
BUN: 14 mg/dL (ref 7–18)
Calcium, Total: 8.9 mg/dL (ref 8.5–10.1)
Chloride: 106 mmol/L (ref 98–107)
Co2: 32 mmol/L (ref 21–32)
Creatinine: 0.91 mg/dL (ref 0.60–1.30)
EGFR (Non-African Amer.): >60
Glucose: 100 mg/dL — ABNORMAL HIGH (ref 65–99)
Osmolality: 284 (ref 275–301)
POTASSIUM: 4.7 mmol/L (ref 3.5–5.1)
SGOT(AST): 21 U/L (ref 15–37)
SODIUM: 142 mmol/L (ref 136–145)
Total Protein: 6.8 g/dL (ref 6.4–8.2)

## 2014-01-20 LAB — CBC CANCER CENTER
BASOS ABS: 0 x10 3/mm (ref 0.0–0.1)
Basophil %: 0.4 %
EOS ABS: 0.1 x10 3/mm (ref 0.0–0.7)
Eosinophil %: 1.8 %
HCT: 37.2 % (ref 35.0–47.0)
HGB: 12.5 g/dL (ref 12.0–16.0)
Lymphocyte #: 1.8 x10 3/mm (ref 1.0–3.6)
Lymphocyte %: 34.5 %
MCH: 32.1 pg (ref 26.0–34.0)
MCHC: 33.6 g/dL (ref 32.0–36.0)
MCV: 96 fL (ref 80–100)
MONO ABS: 0.5 x10 3/mm (ref 0.2–0.9)
MONOS PCT: 9.2 %
NEUTROS ABS: 2.8 x10 3/mm (ref 1.4–6.5)
Neutrophil %: 54.1 %
PLATELETS: 148 x10 3/mm — AB (ref 150–440)
RBC: 3.89 10*6/uL (ref 3.80–5.20)
RDW: 12.7 % (ref 11.5–14.5)
WBC: 5.1 x10 3/mm (ref 3.6–11.0)

## 2014-01-21 LAB — CANCER ANTIGEN 27.29: CA 27.29: 7.2 U/mL (ref 0.0–38.6)

## 2014-02-06 ENCOUNTER — Ambulatory Visit: Payer: Self-pay | Admitting: Oncology

## 2014-03-10 DIAGNOSIS — E039 Hypothyroidism, unspecified: Secondary | ICD-10-CM | POA: Insufficient documentation

## 2014-03-17 DIAGNOSIS — E782 Mixed hyperlipidemia: Secondary | ICD-10-CM | POA: Insufficient documentation

## 2014-03-17 DIAGNOSIS — Z853 Personal history of malignant neoplasm of breast: Secondary | ICD-10-CM | POA: Insufficient documentation

## 2014-05-26 NOTE — Consult Note (Signed)
Reason for Visit: This 69 year old Female patient presents to the clinic for initial evaluation of  Breast cancer .   Referred by Dr. Oliva Bustard.  Diagnosis:   Chief Complaint/Diagnosis   69 year old female status post wide local excision and sentinel node biopsy and adjuvant chemotherapy for he pathologic stage I (T1 B. N0 (S. n)M0) invasive mammary carcinoma in no specific type ER/PR negativeHER-2/neu positive status post adjuvant chemotherapy   Pathology Report Pathology report reviewed    Imaging Report Mammograms ultrasound reviewed    Referral Report Clinical notes reviewed    Planned Treatment Regimen Adjuvant whole breast radiation    HPI   patient is a 69 year old female who presented with an abnormal mammogram of the left breast. She underwent initial biopsy which was positive for invasive mammary carcinoma.Gen. wide local excision and sentinel node biopsy. Tumor was 0.7 cm invasive mammary carcinoma no specific type. Lateral margin was involved and after his 2nd excision was clear. Margins were clear but close at 2 mm. Tumor was ER/PR negative HER-2/neu overexpressed. She was started on adjuvant Cytoxan Adriamycin and we believe had a hypersensitivity reaction to Herceptin consisting of high fever for over a week. Herceptin was discontinued she did complete her Cytoxan Adriamycin. She is doing well about 2 weeks out of chemotherapy. She is having no breast tenderness or pain. She specifically denies cough or bone pain. She is just getting over her fatigue. She tends towards constipation. She seen today for evaluation regarding adjuvant radiation therapy.  Past Hx:    sepsis:    breast cancer:    Hypothyroidism:    Hyperlipidemia:    mild anemia:    HTN:    lumpectomy: Apr 2013   BTL:   Past, Family and Social History:   Past Medical History positive    Cardiovascular hyperlipidemia; hypertension    Endocrine hypothyroidism    Infections History of sepsis     Past Surgical History BTL    Past Medical History Comments Chronic anemia,    Family History positive    Family History Comments Sister with breast cancer    Social History noncontributory    Additional Past Medical and Surgical History Accompanied by her husband today   Allergies:   No Known Allergies:   Home Meds:  Home Medications: Medication Instructions Status  Levoxyl 100 mcg (0.1 mg) oral tablet 1 tab(s) orally once a day in am Active   Review of Systems:   General negative    Performance Status (ECOG) 0    Skin negative    Breast see HPI    Ophthalmologic negative    ENMT negative    Respiratory and Thorax negative    Cardiovascular negative    Gastrointestinal negative    Genitourinary negative    Musculoskeletal negative    Neurological negative    Psychiatric negative    Hematology/Lymphatics negative    Endocrine negative    Allergic/Immunologic negative   Nursing Notes:  Nursing Vital Signs and Chemo Nursing Nursing Notes: *CC Vital Signs Flowsheet:   07-Aug-13 15:14   Temp Temperature 97.8   Pulse Pulse 68   Respirations Respirations 20   SBP SBP 106   DBP DBP 65   Pain Scale (0-10)  1   Current Weight (kg) (kg) 69.1   Height (cm) centimeters 167.6   BSA (m2) 1.7   Physical Exam:  General/Skin/HEENT:   General normal    Skin normal    Eyes normal    ENMT normal  Head and Neck normal    Additional PE Well-developed well-nourished female in NAD. Breasts are symmetric. She has a Port-A-Cath placed in the right anterior chest wall. No dominant mass or nodularity is noted in either breast into position examined. Lungs are clear to A&P cardiac examination shows regular rate and rhythm. Abdomen is benign.   Breasts/Resp/CV/GI/GU:   Respiratory and Thorax normal    Cardiovascular normal    Gastrointestinal normal    Genitourinary normal   MS/Neuro/Psych/Lymph:   Musculoskeletal normal    Neurological normal     Lymphatics normal   Assessment and Plan:  Impression:   stage I breast cancer ER/PR negative HER-2/neu positive status post adjuvant chemotherapy in 69 year old female.  Plan:   at this point, have recommended adjuvant radiation therapy to her left breast. Based on her close surgical margin would add 2000 cGy boost to her scar after whole breast radiation to 5000 cGy. Risks and benefits of treatment including skin irritation exposure to superficial lung volume, and alteration her blood counts were all explained in detail to the patient and her husband. They both seem to comprehend my treatment plan well. Will set her up for CT simulation early next week.  I would like to take this opportunity to thank you for allowing me to continue to participate in this patient's care.  CC Referral:   cc: Dr. Emily Filbert   Electronic Signatures: Baruch Gouty, Roda Shutters (MD)  (Signed 07-Aug-13 16:02)  Authored: HPI, Diagnosis, Past Hx, PFSH, Allergies, Home Meds, ROS, Nursing Notes, Physical Exam, Encounter Assessment and Plan, CC Referring Physician   Last Updated: 07-Aug-13 16:02 by Armstead Peaks (MD)

## 2014-05-31 NOTE — Consult Note (Signed)
PATIENT NAME:  Michelle Johns, Michelle Johns MR#:  [id number] DATE OF BIRTH:  [birth year]  DATE OF CONSULTATION:  07/06/2011  REFERRING PHYSICIAN:   CONSULTING PHYSICIAN:  Lupita Dawn. Candace Cruise, MD  REASON FOR REFERRAL: Rising liver enzymes.   HISTORY OF PRESENT ILLNESS: The patient is a 69 year old white female with a recently diagnosed left breast cancer requiring left lumpectomy. The patient received her first dose of chemotherapy on May 23rd. Shortly thereafter the patient started having fevers up to 102 and then peaked to 103 before she was brought in to the hospital for further evaluation. I was asked to see the patient because of rising liver enzymes.   Since admission the patient has been on various antibiotics for possible pneumonia/pneumonitis. This did not help lower the fever. Blood cultures have been negative. Finally the patient was started on a steroid infusion earlier today. This is the first day she actually feels much better.   The patient denies any prior history of liver disease. There is no family history of liver disease. There is no right upper quadrant pain. There is no nausea or vomiting. The patient denies any prior history of hepatitis. She denies any alcohol use.   REVIEW OF SYSTEMS: Again, there has been fevers. No chills. She did complain of weakness recently. There is no chest pain or palpitations. She did not have any shortness of breath at this time. There is no nausea, vomiting, or abdominal pain. There is no diarrhea or constipation. There is no gross hematochezia, melena, or hematemesis. The rest of the review of systems is negative.   PAST MEDICAL HISTORY:  1. Hyperlipidemia.  2. Hypertension.   3. History of diverticulosis. 4. Hypothyroidism.  5. Most recent diagnosis is breast cancer.   ALLERGIES: There are no allergies to medication.    MEDICATIONS AT HOME:  1. Crestor. 2. Levoxyl. 3. Vitamin D.   FAMILY HISTORY: Notable for heart disease and breast cancer.   SOCIAL  HISTORY: There is no tobacco or regular alcohol use.   PHYSICAL EXAMINATION:   GENERAL: The patient appears to be in no acute distress.  VITAL SIGNS: When I saw her, she was afebrile with temperature of 98.9, pulse 71, respirations 18, blood pressure 104/68, pulse oximetry was 95 on 2 liters.   HEENT: Normocephalic, atraumatic head. Pupils are equally reactive. Throat was clear.   NECK: Supple.   CARDIAC: Regular rhythm and rate with a systolic murmur. I did hear some crackles at the bases bilaterally.   ABDOMEN: Normoactive bowel sounds. It was soft. There is no hepatomegaly. It was nontender. The patient had active bowel sounds. There are no palpable masses.   EXTREMITIES: No clubbing, cyanosis, or edema.   LABORATORY, DIAGNOSTIC, AND RADIOLOGICAL DATA: Most recent labs showed a sodium of 134, potassium 3.6, chloride 97, CO2 28, BUN 10, creatinine 0.87, glucose 99. Bilirubin was 1.4 on admission, AST 79, ALT 105, then the bilirubin normalized to 0.9. AST was down to 63 and ALT is down to 86 on May 27th. However, the last two days the levels have gone up. Alkaline phosphatase was 140 yesterday, now is up to 173. AST was 120, now is 111 and ALT was 115, now at 137. White count has been dropping because of chemo and is now down to 1.0, hemoglobin 11.3, MCV 87. Platelet count is 65. INR is 1.0. Again, all the blood cultures have been negative.   ASSESSMENT AND PLAN: This is a patient with an abnormal liver enzyme which is mainly rising  during the hospitalization. Rise in LFTs during hospitalization usually suggests medication-induced changes. Certainly Crestor can cause liver enzymes to go up but she has been on it a while for her cholesterol levels. She started her chemotherapy last week which can potentially cause liver enzymes to go up as well. Sometimes sepsis can cause liver enzymes to go up. There was no evidence of gallstones at least on the ultrasound. The liver lobe actually looked normal  on the ultrasound as well.   We will recommend continuing to monitor the liver enzymes. If it is medication-induced changes, then usually the liver enzymes will gradually improve on its own after a few days. Will recommend avoiding any hepatotoxic agents. I will order various liver serologies tomorrow to at least rule out other causes such as infectious causes such as viral hepatitis. Will also check some autoimmune liver conditions as well.    Thank you for the referral. I will continue to follow the patient.   ____________________________ Lupita Dawn. Candace Cruise, MD pyo:drc D: 07/06/2011 13:04:25 ET T: 07/06/2011 13:26:26 ET JOB#: [id number]  cc: Lupita Dawn. Candace Cruise, MD, <Dictator> Lupita Dawn Sharai Overbay MD ELECTRONICALLY SIGNED 07/06/2011 14:21

## 2014-05-31 NOTE — Consult Note (Signed)
PATIENT NAME:  Michelle Johns, Michelle Johns MR#:  [id number] DATE OF BIRTH:  08/27/1945  DATE OF CONSULTATION:  07/01/2011  CONSULTING PHYSICIAN:  Simonne Come. Inez Pilgrim, MD  HISTORY/REASON FOR CONSULTATION: Ms. Coil is a 69 year old patient who is primarily followed by Dr. Oliva Bustard and who called me yesterday with fever and later came to the Emergency Room and had a temperature as high as 103. She was transiently hypotensive. She had blood cultures done and was given fluids, started on antibiotics with cefepime and vancomycin. She had an uneventful evening, blood pressure is normal, temperature is down. There was some slight transaminitis and borderline thrombocytopenia. There was no obvious focal site of infection as the patient has no dysuria or skin source, cough, respiratory problems. Chest x-ray is unremarkable, and the port site is unremarkable. The patient had chills and weakness, no chills since admission.   PAST MEDICAL/SURGICAL HISTORY:  1. The pertinent oncology history is she is status post left partial mastectomy on April 3rd and has an ER negative, HER2 positive tumor and received Taxotere, carboplatin and Herceptin on day one, cycle one on May 23rd.  2. She is status post placement of a Port-A-Cath one week earlier.  3. Hypertension.  4. Hyperlipidemia. 5. Hypothyroidism. 6. Colonoscopy back in 2007.   ALLERGIES: No known allergies.   MEDICATIONS: Levoxyl, Crestor and vitamin D.   FAMILY HISTORY: Noncontributory. Her sister had breast cancer.   SOCIAL HISTORY: No alcohol or tobacco.   PHYSICAL EXAMINATION:  VITAL SIGNS: Temperature was 99.2 this morning. Blood pressure was up to 518 systolic. Oxygen saturation saturations were good.   GENERAL: The patient was alert and cooperative.  HEENT: Sclerae no jaundice.   MOUTH: No thrush.   NECK: No mass or adenopathy in the neck.   HEART: Regular.   LUNGS: Clear. No wheezing or rales.   ABDOMEN: Nontender. No palpable mass or organomegaly.    BREASTS: Deferred the breast exam.   SKIN: The port site has some minimal residual bruising and some edema inferior to the port but no fluctuance, no redness, no tenderness.   EXTREMITIES: There is no extremity edema.   NEUROLOGICAL: Grossly nonfocal. Cranial nerves are intact. Moving all extremities against gravity. Affect is normal. I did not test the gait.   LABORATORY, DIAGNOSTIC AND RADIOLOGICAL DATA:  On admission, the urinalysis was unremarkable with a trace esterase, negative protein, only 3 white cells.  Hemoglobin was 11.7, platelets 114, white count was 5.4.  Liver chemistries, ALT and AST are slightly elevated.  Chest x-ray shows an unremarkable port. No infiltrate. Some possible pulmonary vascular congestion or crowded markings.  EKG was normal,  no recent.  MUGA scan was normal prior to starting chemotherapy.   ASSESSMENT/PLAN: The patient is with fever without real viral symptoms. She said she had some scratchy throat but this was fairly minor. No other flulike symptoms and is being treated as a bacterial infection. She is not neutropenic. Unlikely to be chemotherapy-induced fever as it went to 103 with hypotension. There is a murmur which from history is not new. The port was initially not cultured, was not accessed, but was accessed now on the floor and cultures were sent. The patient is on cefepime and vancomycin. There is a mild transaminitis probably from Taxotere although could be from infection. There is mild thrombocytopenia probably from the acute infection. Labs will be watched. Crestor will be held. Cultures will be monitored. Nothing else from Oncology at this point.   ____________________________ Simonne Come. Inez Pilgrim, MD  rgg:cbb D: 07/01/2011 12:12:53 ET T: 07/01/2011 15:13:40 ET JOB#: [id number]  cc: Simonne Come. Inez Pilgrim, MD, <Dictator> Dallas Schimke MD ELECTRONICALLY SIGNED 07/06/2011 10:52

## 2014-05-31 NOTE — Op Note (Signed)
PATIENT NAME:  Michelle Johns, Michelle Johns MR#:  [id number] DATE OF BIRTH:  20-Sep-1945  DATE OF PROCEDURE:  06/21/2011  PREOPERATIVE DIAGNOSIS: Breast cancer.   POSTOPERATIVE DIAGNOSIS: Breast cancer.   PROCEDURE PERFORMED: Insertion of central venous catheter with subcutaneous infusion port.   SURGEON: Rochel Brome, MD  ANESTHESIA: Local 1% Xylocaine with monitored anesthesia care.   INDICATIONS: This 69 year old female recently had surgery for cancer of the left breast and now needing central venous access for chemotherapy.   DESCRIPTION OF PROCEDURE: The patient was placed on the operating table in the supine position and sedated. A rolled sheet was placed behind the shoulder blades. Her head was placed on a support and the neck was extended, the head turned 20 degrees to the left. The neck and right subclavian areas were prepared with ChloraPrep and draped in a sterile manner.   The skin beneath the clavicle was infiltrated with 1% Xylocaine. A transversely oriented 3 cm incision was made and carried down through subcutaneous tissues down to the deep fascia. A subcutaneous pouch was created just anterior to the deep fascia large enough to admit the PFM port.   Next, with the patient in the Trendelenburg position, the jugular vein was identified with ultrasound, also identified the carotid artery. Next, the skin overlying the jugular vein was infiltrated with Xylocaine. A transversely oriented 6 mm incision was made and carried down through subcutaneous tissues. The jugular vein was cannulated with ultrasound guidance and a guidewire was inserted and image recorded, of the ultrasound image, for the paper chart. Next, the dilator and introducer sheath were advanced over the guidewire and the guidewire and dilator were removed. The catheter was advanced down through the sheath and the sheath peeled away. The tip of the catheter was placed into the superior vena cava. This was demonstrated with fluoroscopy and  a fluoroscopic image was saved. Next, the catheter was tunneled down to the subclavian incision and cut to fit and attached to the PFM port with the accompanying sleeve. The port was accessed with a Huber needle, aspirated a trace of blood, and then flushed with heparinized saline solution. Next, the port was sutured to the deep fascia with 4-0 silk. It is noted that during the course of the procedure numerous small bleeding points were cauterized. Hemostasis was subsequently intact. Next, the pouch was closed with 5-0 Vicryl and both incisions were closed with 5-0 Vicryl subcuticular suture and Dermabond. The patient tolerated surgery satisfactorily and was then prepared for transfer to the recovery room. ____________________________ Lenna Sciara. Rochel Brome, MD jws:slb D: 06/21/2011 13:49:53 ET T: 06/21/2011 14:08:08 ET JOB#: [id number]  cc: Loreli Dollar, MD, <Dictator> Loreli Dollar MD ELECTRONICALLY SIGNED 06/21/2011 20:07

## 2014-05-31 NOTE — Consult Note (Signed)
Chief Complaint:   Subjective/Chief Complaint Feels better. Alk phos higher today but AST/ALT improved.   VITAL SIGNS/ANCILLARY NOTES: **Vital Signs.:   30-May-13 13:25   Vital Signs Type Routine   Temperature Temperature (F) 97.9   Celsius 36.6   Temperature Source oral   Pulse Pulse 66   Pulse source per Dinamap   Respirations Respirations 18   Systolic BP Systolic BP 257   Diastolic BP (mmHg) Diastolic BP (mmHg) 61   Mean BP 74   BP Source vital sign device   Pulse Ox % Pulse Ox % 94   Pulse Ox Activity Level  At rest   Oxygen Delivery Room Air/ 21 %   Brief Assessment:   Cardiac Regular    Respiratory clear BS    Gastrointestinal Normal   Lab:  30-May-13 04:00    O2 Saturation (Pulse Ox) 90   FiO2 (Pulse Ox) ra   Assessment/Plan:  Assessment/Plan:   Assessment LFT elevation. Prob medication-induced.    Plan Prob discharge in AM. Pt can f/u with [place] as outpt to go over all the liver serology results later. Thanks.   Electronic Signatures: Verdie Shire (MD)  (Signed [phone] 16:26)  Authored: Chief Complaint, VITAL SIGNS/ANCILLARY NOTES, Brief Assessment, Lab Results, Assessment/Plan   Last Updated: 30-May-13 16:26 by Verdie Shire (MD)

## 2014-05-31 NOTE — Consult Note (Signed)
PATIENT NAME:  Michelle Johns, Michelle Johns MR#:  [id number] DATE OF BIRTH:  07/29/1945  DATE OF CONSULTATION:  07/02/2011  REFERRING PHYSICIAN:   CONSULTING PHYSICIAN:  Raychelle Hudman S. Phylis Bougie, MD  HISTORY OF PRESENT ILLNESS: This patient was admitted to this hospital because of sepsis. The patient had recently had left partial mastectomy performed with lymph node dissection and the patient was found to have Port-A-Cath on the right side. The nurse called me today stating that there is no blood return from the Port-A-Cath but when putting fluid in it it goes very well. They asked me to check it.   I came back to see this patient. The patient is lying comfortably in bed. Her Port-A-Cath area doesn't show any evidence of cellulitis. There is slight swelling but it does not increase when the fluid is given through the Port-A-Cath which goes very easily but on suctioning a slight amount of blood comes but not freely.   PAST MEDICAL HISTORY:  1. The patient underwent left partial mastectomy on April 3rd, ER negative, HER-2 positive tumor. She is on chemotherapy.  2. Hyperlipidemia.  3. Hypothyroidism. 4. Colonoscopy back in 2012.   ALLERGIES: Not known.   MEDICATIONS: She is on oncological medications.   PHYSICAL EXAMINATION: She is laying comfortably in the bed. An area in the port really showed a nice dressing. No evidence of cellulitis. No evidence of any major hematoma and does not look like a major leak from this area.   I told them that we can hold off injecting anything and let Dr. Tamala Julian see her tomorrow and decide whether anything further needs to be done, maybe a chest x-ray. I do not think she will need this replaced. I think when probably suctioned for the blood the catheter probably gets stuck to the vena cava and that is why it's not been getting fluid.   When we put the fluid through the Port-A-Cath it goes freely without any obstruction and there is no obvious swelling when the fluid is given through  the Port-A-Cath. Will let Dr. Tamala Julian know in the morning.   ____________________________ Welford Roche. Phylis Bougie, MD msh:drc D: 07/02/2011 10:45:11 ET T: 07/02/2011 11:02:59 ET JOB#: [id number]  cc: Shaquille Janes S. Phylis Bougie, MD, <Dictator> Sharene Butters MD ELECTRONICALLY SIGNED 07/06/2011 12:34

## 2014-05-31 NOTE — Consult Note (Signed)
if patient is feeling well she can be discharged on blood count i will make arrangement for out patient NEUPOGEN. FOR 5 MORE DATS 20 MGM PO TID . X 5 DAYS . WILL FOLLOW PATIENT AS OUT PATIENT ON MONDAY   AND ARRANGE FOR TAPERING OF STEROID  AND FOLLOW UP CHEST X RAY .CRESTOR TILL LIVER ENZYMES ARE NORMAL   Electronic Signatures: Jobe Gibbon (MD)  (Signed on 31-May-13 05:37)  Authored  Last Updated: 31-May-13 05:37 by Jobe Gibbon (MD)

## 2014-05-31 NOTE — Consult Note (Signed)
Full consult to follow. Pt seen and examined. Rising LFT. Pt denies previous hx of LFT abnormalities. No hx of hepatitis. No alcohol. LFT rising in the hospital suggests medication-induced changes. Effects of chemo? Sepsis can also cause LFT to rise. Liver looked normal on U/S. Feels better after given steroid. Will also order various liver serologies tomorrow to r/o infectious, autoimmune, etc causes. Thanks.   Electronic Signatures: Verdie Shire (MD) (Signed on 29-May-13 20:39)  Authored   Last Updated: 30-May-13 10:02 by Verdie Shire (MD)

## 2014-05-31 NOTE — Consult Note (Signed)
Brief Consult Note: Diagnosis: fever, hypotention, improved, breast cancer.   Patient was seen by consultant.   Consult note dictated.   Comments: see dictated note  patient better, bp up, on cefapime and vanco. has transaminitis, may be from taxotere or infection. has mild thrombocytopenia attribute to infection, as chemotx was 5/23, too soon for cytopenia. port site not tender or inflammed but some edema, and difficult to access.  Plan continue antibiotics, f/u cbc and chemistry , hold crestor, blood cultures from cath, if cath non functioning or signs of local infection, then consult surgery.  Electronic Signatures: Dallas Schimke (MD)  (Signed [phone] 11:56)  Authored: Brief Consult Note   Last Updated: 25-May-13 11:56 by Dallas Schimke (MD)

## 2014-05-31 NOTE — Consult Note (Signed)
PATIENT NAME:  Michelle Johns, Michelle Johns MR#:  [id number] DATE OF BIRTH:  [birth year]  DATE OF CONSULTATION:  07/03/2011  REFERRING PHYSICIAN:   CONSULTING PHYSICIAN:  Loreli Dollar, MD  HISTORY OF PRESENT ILLNESS: This 69 year old female is in the hospital with a fever. She had a recent diagnosis of left breast cancer and also recently had insertion of a central venous catheter with subcutaneous infusion port, which was placed in the right internal jugular vein and also the port was placed in the subclavian position. Since then she has had chemotherapy. She did have blood drawn from the port prior to chemotherapy and also had her chemotherapy on 05/23, but was admitted on the 24th with fever. The possible cause of fever is pneumonitis. She was treated with antibiotics. It is further noted that when she was admitted an effort was made to access the port. However, there was no blood return and therefore surgery consultation was requested.   She reports no significant discomfort at this site.   On examination the port is currently accessed. There appears to be some minimal degree of swelling around the subclavian wound. The port is palpable. The cervical incision is very small and has no swelling.   It is noted that her chest x-ray demonstrated good position of the tip of the catheter.   DIAGNOSES: 1. Breast cancer.  2. Fever.   I discussed this with the patient and also with Dr. Inez Pilgrim, recommended a dye study so that dye is injected through the port for an x-ray. We will get this scheduled for tomorrow. I asked the nurse to de-access the port today and then re-access it tomorrow immediately prior to the dye study, and the de-access after the dye study.  ____________________________ Lenna Sciara. Rochel Brome, MD jws:bjt D:  07/03/2011 13:03:51 ET         T: 07/03/2011 15:50:24 ET         JOB#: [id number]  cc: Loreli Dollar, MD, <Dictator> Loreli Dollar MD ELECTRONICALLY SIGNED 07/05/2011 11:06

## 2014-05-31 NOTE — Op Note (Signed)
PATIENT NAME:  Michelle Johns, Michelle Johns MR#:  [id number] DATE OF BIRTH:  Jun 19, 1945  DATE OF PROCEDURE:  05/10/2011  PREOPERATIVE DIAGNOSIS: Carcinoma of the left breast.   POSTOPERATIVE DIAGNOSIS: Carcinoma of the left breast.   PROCEDURE: Left partial mastectomy with axillary sentinel lymph node biopsy.   SURGEON: Loreli Dollar, MD  ANESTHESIA: General.   INDICATION: This 69 year old female recently had a mammogram depicting a density in the upper outer aspect of the left breast. Ultrasound demonstrated two small nodules. She had ultrasound-guided core biopsy of each of these two small adjacent nodules which demonstrated infiltrating mammary carcinoma and surgery was recommended for definitive treatment.   DESCRIPTION OF PROCEDURE: The patient was placed on the operating table in the supine position under general anesthesia. The left arm was placed on an arm support. The dressing was removed from the lateral aspect of the left breast exposing a Kopans wire which was used for preoperative x-ray needle localization. The wire was cut 3 cm from the skin. The breast was prepared with ChloraPrep and draped in a sterile manner. I also reviewed her sentinel lymph node scan which demonstrated radioactive lymph node in the left axilla. I also reviewed her mammograms with the Kopans wire immediately prior to incision.   The initial incision was made in the left axilla in the inferior aspect of the axilla some 5 cm in length. This was carried down through subcutaneous tissues down through superficial fascia. The gamma counter was used to demonstrate the location of radioactive lymph node which was deep within the inferior aspect of the axilla adjacent to the rib cage. There were two lymph nodes adjacent to one another. It appeared that these had radioactivity. Both were excised in one sample of tissue containing some surrounding fat. The ex vivo count for one lymph node was in the range of 300 to 350 counts per  second and the other lymph node was in the range of 100 to 150 counts per second. This was submitted for frozen section. The background count was less than 16. There was no remaining palpable mass within the axilla.   Attention was turned to do the left partial mastectomy. The Kopans wire was seen entering the left breast in the peripheral aspect of the breast at the 3:00 position. A curvilinear incision was made from the 2:00 to the 4:00 position removing an ellipse of skin which was approximately 12 mm wide. This dissection was carried down through subcutaneous tissues deeper into the breast and removed a sample of tissue surrounding the wire and also during the course of the dissection began to feel a firm mass at the biopsy site. There was also some extensive ecchymosis within the tissues and the specimen was resected. The 2:00 end of the skin ellipse was tagged with a stitch. Margin maps were attached to the specimen with 3-0 nylon sutures marking the medial, lateral, cranial and caudal margins and it appeared that one margin may be close along the inferolateral aspect of the wound and this was submitted for pathology for gross inspection of margins.   The pathologist called back to indicate that the sentinel lymph nodes were quite fatty, did not cut well and diagnosis reserved until routine sections. Subsequently, the axillary wound was inspected. Hemostasis was intact. The wound was closed with running 5-0 Monocryl subcuticular suture.   The pathologist also called back to indicate that the lateral margin was close and therefore I re-excised lateral margin removing a sample of tissue which was  approximately 3 x 3 cm and approximately 8 mm thick and this was placed on Telfa and sutured to the Telfa so that the Telfa marked the new margin and submitted for routine pathology. The wound was inspected. It is noted that one clamped vessel was suture ligated with 4-0 chromic. Several small bleeding points were  cauterized. Hemostasis was subsequently intact. Subcutaneous tissues were closed with interrupted 4-0 chromic and the skin was closed with running 4-0 Monocryl subcuticular suture.   Both wounds were then treated with Dermabond. Patient tolerated surgery satisfactorily and was then prepared for transfer to the recovery room.  ____________________________ Lenna Sciara. Rochel Brome, MD jws:cms D: 05/10/2011 13:22:16 ET T: 05/10/2011 13:42:19 ET JOB#: [id number]  cc: Loreli Dollar, MD, <Dictator> Loreli Dollar MD ELECTRONICALLY SIGNED 05/11/2011 10:20

## 2014-05-31 NOTE — H&P (Signed)
PATIENT NAME:  Michelle Johns, Michelle Johns MR#:  [id number] DATE OF BIRTH:  [birth year]  DATE OF ADMISSION:  07/01/2011  REFERRING PHYSICIAN: Lurline Hare, MD   PRIMARY CARE PHYSICIAN: Emily Filbert, MD   PRIMARY ONCOLOGIST: Forest Gleason, MD  PRESENTING COMPLAINT: Fever.   HISTORY OF PRESENT ILLNESS: Michelle Johns is a pleasant 69 year old woman with history of left breast cancer recently diagnosed, status post left partial mastectomy on 05/10/2011 with axillary lymph node biopsy that is ER/PR negative and HER2 new positive. The patient is being followed by Dr. Oliva Bustard and received her first dose of chemotherapy on 06/29/2011. On the evening of May 24th, the patient developed fever of 102. Her husband called in the oncologist on call, Dr. Inez Pilgrim, who recommended following serial temperatures, and she peaked at 103 temperature and was brought in for further evaluation. She denies any cough, wheezing. No painful urination or hematuria. No abdominal pain or diarrhea. No nausea or vomiting. She denies any bone pain or neck pain. No sinus pain. She denies any drainage, redness, or tenderness over her Port-A-Cath site. She endorses mainly fevers and chills which have improved since receiving Tylenol here.   PAST MEDICAL HISTORY:  1. History of transaminitis.  2. Hypothyroidism.  3. Hyperlipidemia.  4. Hypertension.  5. In July of 2007, colonoscopy revealing for diverticulosis.  6. Recent diagnosis of left breast cancer, status post left partial mastectomy on 05/10/2011 with axillary lymph node biopsy which is ER/PR negative and HER2 new positive.   PAST SURGICAL HISTORY: As above, and also a Port-A-Cath placement on 06/21/2011.   ALLERGIES: No known drug allergies.   MEDICATIONS:  1. Crestor 10 mg daily.  2. Levoxyl 100 mcg daily.  3. Vitamin D3 2000 international units daily.  4. The patient is no longer on chlorthalidone. She takes it as needed, 25 mg daily.   FAMILY HISTORY: Sister with breast cancer. Both  parents with heart disease.   SOCIAL HISTORY: She lives in [place] with her husband. 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 No tobacco. She has occasional wine use. No drugs.   REVIEW OF SYSTEMS: CONSTITUTIONAL: Endorses fevers, chills. EYES: No glaucoma or cataracts. ENT: No epistaxis, discharge, drainage. RESPIRATORY: No cough, wheezing, hemoptysis, or shortness of breath. CARDIOVASCULAR: No chest pain, edema, palpitations, syncope. GI: No nausea, vomiting, endorses constipation. Last bowel movement 3 to 4 days ago but no abdominal pain, hematemesis, or melena. GENITOURINARY: No dysuria or hematuria. ENDOCRINE: No polyuria or polydipsia. HEMATOLOGIC: No easy bleeding or bruising. SKIN: No rash or ulcers. MUSCULOSKELETAL: No neck pain, joint pain or swelling. NEUROLOGICAL: No history of stroke or seizure. PSYCHIATRIC: Denies any suicidal ideation.   PHYSICAL EXAMINATION:  VITAL SIGNS: Temperature 103.1 on presentation, pulse of 82, respiratory rate 18, blood pressure initially 138/65 with drop in blood pressure to 87/38, sating at 96% on room air.   GENERAL: Lying in bed in no apparent distress.   HEENT: Normocephalic, atraumatic. Pupils are equal and symmetric, anicteric. Nares are without discharge. Oropharynx is without any erythema or exudate. Moist mucous membranes.   NECK: Soft and supple. No adenopathy or JVP.   CARDIOVASCULAR: Nontachycardic. She has a 2+ systolic murmur. No rubs or gallops.   LUNGS: Faint basilar crackles. No use of accessory muscles or increased respiratory effort.   ABDOMEN: Soft. Positive bowel sounds. No mass appreciated, nontender.   EXTREMITIES: No edema. Dorsal pedis pulses intact.   MUSCULOSKELETAL: No joint effusion.  SKIN: No ulcers.   NEUROLOGIC: No dysarthria or aphasia. Symmetrical strength. No focal deficits.   PSYCHIATRIC: She is alert and oriented. The patient is cooperative.    PERTINENT LABORATORY, DIAGNOSTIC AND RADIOLOGICAL DATA:  Urinalysis with specific gravity of 1.019, pH 5, negative protein, negative nitrite, trace leukocyte esterase, RBC 1 per high-power field, WBC 3 per high-power field. WBC 5.4, hemoglobin 11.7, hematocrit 34.1, platelets 114, MCV 89. Glucose 102, BUN 18, creatinine 1.03, sodium 140, potassium 3.8, chloride 105, carbon dioxide 25, calcium 7.8, total bilirubin 0.7, alkaline phosphatase 94, ALT 93, AST 86, total protein 6.3. Troponin less than 0.02.  EKG with sinus rate of 75. No ST changes. Chest x-ray without any infiltrates.   ASSESSMENT AND PLAN: Michelle Johns is a pleasant 69 year old woman with history of left breast cancer, status post partial mastectomy, started on chemotherapy via Port-A-Cath on May 23rd, history of hypothyroidism, hypertension, hyperlipidemia, presenting with fever and chills.   1. Sepsis and fever, questionable line sources versus developing pneumonia. No respiratory symptoms. We will repeat a chest x-ray posthydration. Could be in the setting of the chemotherapy alone; however, 103 seems to be quite elevated for chemotherapy-related fever. Per patient, her murmur is not new. Continue to follow. If no improvement in fever, consider obtaining an echocardiogram. Her blood cultures are pending. Her Port-A-Cath site was not able to be accessed for culture. We will cover with cefepime and vancomycin for now until her cultures return. Oncology consultation. We will continue IV fluids and p.r.n. IV bolus for relative hypotension.  2. Hypertension: No longer on medications as above, now with relative hypotension. Continue IV fluids.  3. Anemia, thrombocytopenia postchemotherapy treatment: Continue to follow. The patient is currently not neutropenic.  4. Hyperlipidemia: Restart Crestor.  5. Hypothyroidism: Restart Levoxyl.  6. Constipation: Start her on a bowel regimen.  7. Prophylaxis: Lovenox and Protonix.  TIME SPENT:  Approximately 45 minutes spent on patient care.   ____________________________ Rita Ohara, MD ap:cbb D: 07/01/2011 01:56:58 ET T: 07/01/2011 09:36:34 ET JOB#: [id number]  cc: Brien Few Euclide Granito, MD, <Dictator> Rusty Aus, MD Rita Ohara MD ELECTRONICALLY SIGNED 07/05/2011 23:04

## 2014-05-31 NOTE — Consult Note (Signed)
History of Present Illness:   Reason for Consult Positive breast biopsy performed by Dr. Rochel Brome in April 2013. Noted to have 2 small nodules.  (0.7 and 0.3 cm nodules)  T1 B. N0 (sentinel lymph node) M0  ER negative  PR negative  HER-2/neu positive by FISH    Date of Diagnosis 02-May-2011    Lab Report ER/ PR -, HER2 +    HPI   Pt with  previous yearly mammograms. In March noted to have abnormality on mammogram. patient remains asymptomatic slightly apprehensive not in any acute distress.abnormal mammogram of the left breast and patient underwent partial mastectomy and central lymph node evaluation she is here for ongoing evaluation and treatment consideration.  She is asymptomatic accompanied by her husband  PFSH:   Family History positive, sister with breast cancer    Social History negative alcohol, negative tobacco    Additional Past Medical and Surgical History Hypercholesterolemia  Hypothyroidism   Review of Systems:   General denies complaints    Performance Status (ECOG) 0    HEENT no complaints    Lungs no complaints    Cardiac no complaints    GI no complaints    GU no complaints    Musculoskeletal no complaints    Extremities no complaints    Skin no complaints    Neuro no complaints    Endocrine no complaints    Psych no complaints   NURSING NOTES: *CC Vital Signs Flowsheet:   17-Apr-13 08:12    Temp Temperature: 97.4    Pulse Pulse: 54    Respirations Respirations: 20    SBP SBP: 107    DBP DBP: 71    Pain Scale (0-10): 2    Current Weight (kg) (kg): 81.4    Height (cm) centimeters: 167.6    BSA (m2): 1.9   Physical Exam:   HEENT: normal    Lungs: clear    Cardiac: regular rate, rhythm    Abdomen: soft    Skin: intact    Extremities: No edema, rash or cyanosis    Neuro: AAOx3    Psych: normal appearance    Physical Exam Examination of both breasts.  Right breast free of masses.  Left breast wound is healing  well.  No palpable axillary lymph node. LYMPHATICS:   No cervical, axillary, or inguinal lymphadenopathy     breast cancer:    Hypothyroidism:    Hyperlipidemia:    mild anemia:    HTN:    lumpectomy:    BTL:    No Known Allergies:     Levoxyl 100 mcg (0.1 mg) oral tablet: 1 tab(s) orally once a day in am, Active, 0, None   Crestor 10 mg oral tablet: 1 tab(s) orally once a day (in the morning), Active, 0, None    04-Sep-12 10:13, US Breast Left    07-Mar-13 14:50, Digital Unilateral Left Breast    07-Mar-13 16:43, US Breast Left    26-Mar-13 14:24, US Guided Breast Biopsy Left    03-Apr-13 09:25, Sentinel Node Mapping Breast - Nuc Med    03-Apr-13 10:22, Breast Needle Localization - Left Breast    03-Apr-13 12:17, Breast Specimen Rad Exam   Assessment and Plan:  Impression:   1.  cancer  of left breast status post partial mastectomy and sentinel lymph nodes are negative..  Margins are close but negative.IB tumor.  ER negative.  PR negative.  HER-2 receptor positive by FISH I had prolonged discussion with patient.  Considering patient's young age with no significant medical problem.  Considering the fact that patient had strongly heart so positive tumor I would recommend adjuvant chemotherapy with TCH and Herceptin.  [place] every 3 weeks for 6 cycles followed by Herceptin for a whole year Proceed free to port placement Patient will get radiation therapy for local adjuvant treatment. As estrogen and progesterone receptor negative she would not  benefit  by any  anti- hormonal treatment.proceed with MUGA scan of the heart Pros and cons of chemotherapy and side effects has been discussed. Intent of chemotherapy is curative. effects of chemotherapy have been discussed.consent has been obtained.and family demonstrated understanding.  CC Referral:   cc: Emily Filbert.  Dr. Rochel Brome.   Treatment Plan:    Treatment Plan chemotherapy    Electronic Signatures: Adrielle Polakowski, Martie Lee  (MD)  (Signed 17-Apr-13 11:02)  Authored: HISTORY OF PRESENT ILLNESS, PFSH, ROS, NURSING NOTES, PE, PAST MEDICAL HISTORY, ALLERGIES, HOME MEDICATIONS, OTHER RESULTS, ASSESSMENT AND PLAN, CC Referring Physician, TREATMENT PLAN   Last Updated: 17-Apr-13 11:02 by Jobe Gibbon (MD)

## 2014-05-31 NOTE — Discharge Summary (Signed)
PATIENT NAME:  Michelle Johns, Michelle Johns MR#:  [id number] DATE OF BIRTH:  [birth year]  DATE OF ADMISSION:  07/01/2011 DATE OF DISCHARGE:  07/07/2011  DISCHARGE DIAGNOSES:  1. Hypersensitivity pneumonitis.  2. Drug-induced hepatitis. 3. Febrile neutropenia post chemotherapy.  4. Breast carcinoma.  5. Hypothyroidism.   DISCHARGE MEDICATIONS:  1. Prednisone 20 mg t.i.d.  2. Levaquin 500 mg daily x5 days.  3. Levoxyl 100 mcg daily.   REASON FOR ADMISSION: 69 year old female presents with fever and progressive neutropenia with transaminitis. Please see history and physical for history of present illness, past medical history, physical exam.   HOSPITAL COURSE: Patient was admitted, placed on vancomycin and cefepime. Her cultures were negative x3 sets. She had worsening respiratory issues with chest CT showing bilateral ground-glass opacities. She continued to be febrile despite the antibiotics and it became more clear that even as her white count diminished to significant neutropenia that this is likely not an infectious etiology but more of a hypersensitivity to the recent chemotherapy. Dr. Oliva Bustard agreed with this. The antibiotic was switched ultimately to p.o. Levaquin. She was placed on prednisone and the prednisone seemed to make the biggest difference with her liver enzymes as well as her respiratory status. Chest x-ray continued to show the interstitial disease. She will follow up with Dr. Oliva Bustard in relation to follow up and to tapering the prednisone. Chemistries within normal limits. White count which had been down to significant neutropenia was 9.8 on discharge. Overall prognosis is good. ____________________________ Rusty Aus, MD mfm:cms D: 07/07/2011 07:48:50 ET T: 07/07/2011 11:26:41 ET  JOB#: [id number] cc: Rusty Aus, MD, <Dictator> Chirstopher Iovino Roselee Culver MD ELECTRONICALLY SIGNED 07/09/2011 9:50

## 2014-07-23 ENCOUNTER — Inpatient Hospital Stay: Payer: Medicare Other | Attending: Oncology

## 2014-07-23 ENCOUNTER — Other Ambulatory Visit: Payer: Self-pay | Admitting: *Deleted

## 2014-07-23 DIAGNOSIS — Z9221 Personal history of antineoplastic chemotherapy: Secondary | ICD-10-CM | POA: Insufficient documentation

## 2014-07-23 DIAGNOSIS — E785 Hyperlipidemia, unspecified: Secondary | ICD-10-CM | POA: Diagnosis not present

## 2014-07-23 DIAGNOSIS — E039 Hypothyroidism, unspecified: Secondary | ICD-10-CM | POA: Insufficient documentation

## 2014-07-23 DIAGNOSIS — Z79899 Other long term (current) drug therapy: Secondary | ICD-10-CM | POA: Diagnosis not present

## 2014-07-23 DIAGNOSIS — Z803 Family history of malignant neoplasm of breast: Secondary | ICD-10-CM | POA: Insufficient documentation

## 2014-07-23 DIAGNOSIS — Z923 Personal history of irradiation: Secondary | ICD-10-CM | POA: Diagnosis not present

## 2014-07-23 DIAGNOSIS — Z853 Personal history of malignant neoplasm of breast: Secondary | ICD-10-CM | POA: Diagnosis present

## 2014-07-23 DIAGNOSIS — I10 Essential (primary) hypertension: Secondary | ICD-10-CM | POA: Diagnosis not present

## 2014-07-23 DIAGNOSIS — D649 Anemia, unspecified: Secondary | ICD-10-CM | POA: Insufficient documentation

## 2014-07-23 DIAGNOSIS — K1370 Unspecified lesions of oral mucosa: Secondary | ICD-10-CM | POA: Diagnosis not present

## 2014-07-23 DIAGNOSIS — C50919 Malignant neoplasm of unspecified site of unspecified female breast: Secondary | ICD-10-CM

## 2014-07-23 LAB — COMPREHENSIVE METABOLIC PANEL
ALK PHOS: 74 U/L (ref 38–126)
ALT: 17 U/L (ref 14–54)
AST: 23 U/L (ref 15–41)
Albumin: 4 g/dL (ref 3.5–5.0)
Anion gap: 4 — ABNORMAL LOW (ref 5–15)
BILIRUBIN TOTAL: 0.8 mg/dL (ref 0.3–1.2)
BUN: 15 mg/dL (ref 6–20)
CALCIUM: 9 mg/dL (ref 8.9–10.3)
CO2: 28 mmol/L (ref 22–32)
Chloride: 106 mmol/L (ref 101–111)
Creatinine, Ser: 0.85 mg/dL (ref 0.44–1.00)
GFR calc non Af Amer: >60 mL/min (ref >60)
GLUCOSE: 90 mg/dL (ref 65–99)
Potassium: 4.1 mmol/L (ref 3.5–5.1)
Sodium: 138 mmol/L (ref 135–145)
TOTAL PROTEIN: 7 g/dL (ref 6.5–8.1)

## 2014-07-23 LAB — CBC WITH DIFFERENTIAL/PLATELET
Basophils Absolute: 0 10*3/uL (ref 0–0.1)
Basophils Relative: 0 %
EOS ABS: 0.1 10*3/uL (ref 0–0.7)
Eosinophils Relative: 2 %
HCT: 36.6 % (ref 35.0–47.0)
Hemoglobin: 12.3 g/dL (ref 12.0–16.0)
LYMPHS PCT: 39 %
Lymphs Abs: 1.8 10*3/uL (ref 1.0–3.6)
MCH: 31.8 pg (ref 26.0–34.0)
MCHC: 33.5 g/dL (ref 32.0–36.0)
MCV: 94.9 fL (ref 80.0–100.0)
MONOS PCT: 10 %
Monocytes Absolute: 0.5 10*3/uL (ref 0.2–0.9)
Neutro Abs: 2.3 10*3/uL (ref 1.4–6.5)
Neutrophils Relative %: 49 %
Platelets: 154 10*3/uL (ref 150–440)
RBC: 3.85 MIL/uL (ref 3.80–5.20)
RDW: 12.8 % (ref 11.5–14.5)
WBC: 4.7 10*3/uL (ref 3.6–11.0)

## 2014-07-24 LAB — CANCER ANTIGEN 27.29: CA 27.29: 16.8 U/mL (ref 0.0–38.6)

## 2014-07-30 ENCOUNTER — Inpatient Hospital Stay (HOSPITAL_BASED_OUTPATIENT_CLINIC_OR_DEPARTMENT_OTHER): Payer: Medicare Other | Admitting: Oncology

## 2014-07-30 VITALS — BP 108/70 | HR 60 | Temp 97.1°F | Wt 159.4 lb

## 2014-07-30 DIAGNOSIS — Z79899 Other long term (current) drug therapy: Secondary | ICD-10-CM

## 2014-07-30 DIAGNOSIS — Z9221 Personal history of antineoplastic chemotherapy: Secondary | ICD-10-CM

## 2014-07-30 DIAGNOSIS — Z803 Family history of malignant neoplasm of breast: Secondary | ICD-10-CM

## 2014-07-30 DIAGNOSIS — D649 Anemia, unspecified: Secondary | ICD-10-CM

## 2014-07-30 DIAGNOSIS — C50912 Malignant neoplasm of unspecified site of left female breast: Secondary | ICD-10-CM

## 2014-07-30 DIAGNOSIS — E039 Hypothyroidism, unspecified: Secondary | ICD-10-CM | POA: Diagnosis not present

## 2014-07-30 DIAGNOSIS — Z923 Personal history of irradiation: Secondary | ICD-10-CM

## 2014-07-30 DIAGNOSIS — I10 Essential (primary) hypertension: Secondary | ICD-10-CM

## 2014-07-30 DIAGNOSIS — C50919 Malignant neoplasm of unspecified site of unspecified female breast: Secondary | ICD-10-CM

## 2014-07-30 DIAGNOSIS — Z853 Personal history of malignant neoplasm of breast: Secondary | ICD-10-CM | POA: Diagnosis not present

## 2014-07-30 DIAGNOSIS — K1370 Unspecified lesions of oral mucosa: Secondary | ICD-10-CM

## 2014-07-30 DIAGNOSIS — E785 Hyperlipidemia, unspecified: Secondary | ICD-10-CM

## 2014-07-30 MED ORDER — MAGIC MOUTHWASH: 5.0000 mL | Freq: Three times a day (TID) | ORAL | Status: DC | PRN

## 2014-07-30 NOTE — Progress Notes (Signed)
Patient does have living will. Never smoked. 

## 2014-08-10 ENCOUNTER — Encounter: Payer: Self-pay | Admitting: Oncology

## 2014-08-10 DIAGNOSIS — C50912 Malignant neoplasm of unspecified site of left female breast: Secondary | ICD-10-CM | POA: Insufficient documentation

## 2014-08-10 NOTE — Progress Notes (Signed)
Diagnostic study IMPRESSION: No mammographic evidence of malignancy.  RECOMMENDATION: Bilateral diagnostic mammogram in 1 year. I have discussed the findings and recommendations with the patient. Results were also provided in writing at the conclusion of the visit. If applicable, a reminder letter will be sent to the patient regarding the next appointment.  BI-RADS CATEGORY  2: Benign Finding(s

## 2014-08-10 NOTE — Progress Notes (Signed)
[place] @ [facility] [phone]) [phone]  Fax:(336) [place]: May 05, 1945  MR#: [id number]  FAO#:[id number]  Patient Care Team: Rusty Aus, MD as PCP - General (Internal Medicine)  CHIEF COMPLAINT:  Chief Complaint  Patient presents with  . Follow-up    Oncology History   Chief Complaint/Diagnosis:   69 year old female status post wide local excision and sentinel node biopsy and adjuvant chemotherapy for he pathologic stage I (T1 B. N0 (S. 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  Patient has finished  4 cycles (one cycle with TCH, 3 cycles with Cytoxan and Adriamycin) patient had severe hypersensitivie pneumonitis was considered secondary to Herceptin sore Herceptin was discontinued.  Patient finished chemotherapy  august of 2013 4.finished radiation to the breast   Summary of Treatment   site treated left breast To Ms. 5040 cGy in 28 fractions Start date September 28, 2011 Complete date November 07, 2011 Using 6 MV photons three-dimensional treatment planning     Cancer of left breast    No flowsheet data found.  INTERVAL HISTORY:  69 year old lady with carcinoma of left breast.  ER negative PR negative HER-2/neu positive.  Patient came for further follow-up.  No chills.  No fever.  No bony pains. GETTING regular mammogram done  REVIEW OF SYSTEMS:   GENERAL:  Feels good.  Active.  No fevers, sweats or weight loss. PERFORMANCE STATUS (ECOG):  0 HEENT:  Patient continues to have multiple ulcers off and on in the mouth.  Etiology of which is not clear.  Patient was given trial with Zovirax without much help . Lungs: No shortness of breath or cough.  No hemoptysis. Cardiac:  No chest pain, palpitations, orthopnea, or PND. GI:  No nausea, vomiting, diarrhea, constipation, melena or hematochezia. GU:  No urgency, frequency, dysuria, or hematuria. Musculoskeletal:  No back  pain.  No joint pain.  No muscle tenderness. Extremities:  No pain or swelling. Skin:  No rashes or skin changes. Neuro:  No headache, numbness or weakness, balance or coordination issues. Endocrine:  No diabetes, thyroid issues, hot flashes or night sweats. Psych:  No mood changes, depression or anxiety. Pain:  No focal pain. Review of systems:  All other systems reviewed and found to be negative. As per HPI. Otherwise, a complete review of systems is negatve.  PAST MEDICAL HISTORY: Past Medical History  Diagnosis Date  . Cancer of left breast 08/10/2014    Significant History/PMH:   sepsis:    breast cancer:    Hypothyroidism:    Hyperlipidemia:    mild anemia:    HTN:    lumpectomy: Apr 2013   BTL:   Preventive Screening:  Has patient had any of the following test? Mammography (1)   Last Mammography: December 2014(1)   Smoking History: Smoking History Never Smoked.(1)  PFSH: Family History: positive, sister with breast cancer  Social History: negative alcohol, negative tobacco  Additional Past Medical and Surgical History: Hypercholesterolemia   Hypothyroidism   ADVANCED DIRECTIVES:  Patient does have a living will HEALTH MAINTENANCE: History  Substance Use Topics  . Smoking status: Never Smoker   . Smokeless tobacco: Not on file  . Alcohol Use: Not on file      Not on File  Current Outpatient Prescriptions  Medication Sig Dispense Refill  . levothyroxine (SYNTHROID, LEVOTHROID) 100 MCG tablet Take by mouth.    . Alum & Mag Hydroxide-Simeth (MAGIC MOUTHWASH)  SOLN Take 5 mLs by mouth 3 (three) times daily as needed for mouth pain. Swish and spit. 240 mL 3  . Cholecalciferol (VITAMIN D-1000 MAX ST) 1000 UNITS tablet Take by mouth.    . CRESTOR 10 MG tablet     . Cyanocobalamin (RA VITAMIN B-12 TR) 1000 MCG TBCR Take by mouth.     No current facility-administered medications for this visit.    OBJECTIVE:  Filed Vitals:   07/30/14 1204  BP:  108/70  Pulse: 60  Temp: 97.1 F (36.2 C)     There is no height on file to calculate BMI.    ECOG FS:0 - Asymptomatic  PHYSICAL EXAM: GENERAL:  Well developed, well nourished, sitting comfortably in the exam room in no acute distress. MENTAL STATUS:  Alert and oriented to person, place and time. HEAD:  .  Normocephalic, atraumatic, face symmetric, no Cushingoid features. EYES:  .  Pupils equal round and reactive to light and accomodation.  No conjunctivitis or scleral icterus. ENT:  Oropharynx clear without lesion.  Tongue normal. Mucous membranes moist.  RESPIRATORY:  Clear to auscultation without rales, wheezes or rhonchi. CARDIOVASCULAR:  Regular rate and rhythm without murmur, rub or gallop. BREAST:  Right breast without masses, skin changes or nipple discharge.  Left breast without masses, skin changes or nipple discharge. ABDOMEN:  Soft, non-tender, with active bowel sounds, and no hepatosplenomegaly.  No masses. BACK:  No CVA tenderness.  No tenderness on percussion of the back or rib cage. SKIN:  No rashes, ulcers or lesions. EXTREMITIES: No edema, no skin discoloration or tenderness.  No palpable cords. LYMPH NODES: No palpable cervical, supraclavicular, axillary or inguinal adenopathy  NEUROLOGICAL: Unremarkable. PSYCH:  Appropriate.   LAB RESULTS:  No visits with results within 2 Day(s) from this visit. Latest known visit with results is:  Appointment on 07/23/2014  Component Date Value Ref Range Status  . WBC 07/23/2014 4.7  3.6 - 11.0 K/uL Final  . RBC 07/23/2014 3.85  3.80 - 5.20 MIL/uL Final  . Hemoglobin 07/23/2014 12.3  12.0 - 16.0 g/dL Final  . HCT 07/23/2014 36.6  35.0 - 47.0 % Final  . MCV 07/23/2014 94.9  80.0 - 100.0 fL Final  . MCH 07/23/2014 31.8  26.0 - 34.0 pg Final  . MCHC 07/23/2014 33.5  32.0 - 36.0 g/dL Final  . RDW 07/23/2014 12.8  11.5 - 14.5 % Final  . Platelets 07/23/2014 154  150 - 440 K/uL Final  . Neutrophils Relative % 07/23/2014 49    Final  . Neutro Abs 07/23/2014 2.3  1.4 - 6.5 K/uL Final  . Lymphocytes Relative 07/23/2014 39   Final  . Lymphs Abs 07/23/2014 1.8  1.0 - 3.6 K/uL Final  . Monocytes Relative 07/23/2014 10   Final  . Monocytes Absolute 07/23/2014 0.5  0.2 - 0.9 K/uL Final  . Eosinophils Relative 07/23/2014 2   Final  . Eosinophils Absolute 07/23/2014 0.1  0 - 0.7 K/uL Final  . Basophils Relative 07/23/2014 0   Final  . Basophils Absolute 07/23/2014 0.0  0 - 0.1 K/uL Final  . Sodium 07/23/2014 138  135 - 145 mmol/L Final  . Potassium 07/23/2014 4.1  3.5 - 5.1 mmol/L Final  . Chloride 07/23/2014 106  101 - 111 mmol/L Final  . CO2 07/23/2014 28  22 - 32 mmol/L Final  . Glucose, Bld 07/23/2014 90  65 - 99 mg/dL Final  . BUN 07/23/2014 15  6 - 20 mg/dL Final  .  Creatinine, Ser 07/23/2014 0.85  0.44 - 1.00 mg/dL Final  . Calcium 07/23/2014 9.0  8.9 - 10.3 mg/dL Final  . Total Protein 07/23/2014 7.0  6.5 - 8.1 g/dL Final  . Albumin 07/23/2014 4.0  3.5 - 5.0 g/dL Final  . AST 07/23/2014 23  15 - 41 U/L Final  . ALT 07/23/2014 17  14 - 54 U/L Final  . Alkaline Phosphatase 07/23/2014 74  38 - 126 U/L Final  . Total Bilirubin 07/23/2014 0.8  0.3 - 1.2 mg/dL Final  . GFR calc non Af Amer 07/23/2014 >60  >60 mL/min Final  . GFR calc Af Amer 07/23/2014 >60  >60 mL/min Final   Comment: (NOTE) The eGFR has been calculated using the CKD EPI equation. This calculation has not been validated in all clinical situations. eGFR's persistently <60 mL/min signify possible Chronic Kidney Disease.   . Anion gap 07/23/2014 4* 5 - 15 Final  . CA 27.29 07/23/2014 16.8  0.0 - 38.6 U/mL Final   Comment: (NOTE) Bayer Centaur/ACS methodology Performed At: [facility] [address] [place], [place] [id number] Lindon Romp MD WT:[id number]        ASSESSMENT: Carcinoma of left breast stage I.  T1b N0 M0 tumor.  Estrogen receptor negative progesterone receptor negative and HER-2/neu receptor positive On  clinical examination there is no evidence of recurrent disease  MEDICAL DECISION MAKING:  All lab data has been reviewed. Tumor markers are within normal limit No date from mammogram at present time Multiples SORES  in the mouth ENT evaluation would be planned  Patient expressed understanding and was in agreement with this plan. She also understands that She can call clinic at any time with any questions, concerns, or complaints.    No matching staging information was found for the patient.  Forest Gleason, MD   08/10/2014 6:03 PM

## 2015-01-22 ENCOUNTER — Inpatient Hospital Stay: Payer: Medicare Other | Attending: Oncology

## 2015-01-22 DIAGNOSIS — C50912 Malignant neoplasm of unspecified site of left female breast: Secondary | ICD-10-CM | POA: Insufficient documentation

## 2015-01-22 DIAGNOSIS — C50919 Malignant neoplasm of unspecified site of unspecified female breast: Secondary | ICD-10-CM

## 2015-01-22 LAB — CBC WITH DIFFERENTIAL/PLATELET
BASOS ABS: 0 10*3/uL (ref 0–0.1)
Basophils Relative: 0 %
EOS PCT: 3 %
Eosinophils Absolute: 0.1 10*3/uL (ref 0–0.7)
HCT: 37.1 % (ref 35.0–47.0)
Hemoglobin: 12.3 g/dL (ref 12.0–16.0)
Lymphocytes Relative: 30 %
Lymphs Abs: 1.6 10*3/uL (ref 1.0–3.6)
MCH: 31.2 pg (ref 26.0–34.0)
MCHC: 33.2 g/dL (ref 32.0–36.0)
MCV: 94.1 fL (ref 80.0–100.0)
MONOS PCT: 8 %
Monocytes Absolute: 0.4 10*3/uL (ref 0.2–0.9)
Neutro Abs: 3.1 10*3/uL (ref 1.4–6.5)
Neutrophils Relative %: 59 %
PLATELETS: 155 10*3/uL (ref 150–440)
RBC: 3.95 MIL/uL (ref 3.80–5.20)
RDW: 12.9 % (ref 11.5–14.5)
WBC: 5.3 10*3/uL (ref 3.6–11.0)

## 2015-01-22 LAB — COMPREHENSIVE METABOLIC PANEL
ALT: 17 U/L (ref 14–54)
ANION GAP: 6 (ref 5–15)
AST: 23 U/L (ref 15–41)
Albumin: 3.9 g/dL (ref 3.5–5.0)
Alkaline Phosphatase: 78 U/L (ref 38–126)
BILIRUBIN TOTAL: 0.6 mg/dL (ref 0.3–1.2)
BUN: 13 mg/dL (ref 6–20)
CO2: 28 mmol/L (ref 22–32)
Calcium: 9.1 mg/dL (ref 8.9–10.3)
Chloride: 103 mmol/L (ref 101–111)
Creatinine, Ser: 0.91 mg/dL (ref 0.44–1.00)
Glucose, Bld: 89 mg/dL (ref 65–99)
POTASSIUM: 3.5 mmol/L (ref 3.5–5.1)
Sodium: 137 mmol/L (ref 135–145)
TOTAL PROTEIN: 6.8 g/dL (ref 6.5–8.1)

## 2015-01-23 LAB — CANCER ANTIGEN 27.29: CA 27.29: 5.2 U/mL (ref 0.0–38.6)

## 2015-02-02 ENCOUNTER — Ambulatory Visit: Payer: Medicare Other | Admitting: Oncology

## 2015-02-10 ENCOUNTER — Inpatient Hospital Stay: Payer: Medicare Other | Attending: Oncology | Admitting: Oncology

## 2015-02-10 VITALS — BP 114/71 | HR 58 | Temp 96.6°F | Resp 18 | Wt 162.7 lb

## 2015-02-10 DIAGNOSIS — E785 Hyperlipidemia, unspecified: Secondary | ICD-10-CM | POA: Diagnosis not present

## 2015-02-10 DIAGNOSIS — D649 Anemia, unspecified: Secondary | ICD-10-CM | POA: Diagnosis not present

## 2015-02-10 DIAGNOSIS — Z853 Personal history of malignant neoplasm of breast: Secondary | ICD-10-CM | POA: Diagnosis present

## 2015-02-10 DIAGNOSIS — I10 Essential (primary) hypertension: Secondary | ICD-10-CM | POA: Diagnosis not present

## 2015-02-10 DIAGNOSIS — Z9221 Personal history of antineoplastic chemotherapy: Secondary | ICD-10-CM | POA: Diagnosis not present

## 2015-02-10 DIAGNOSIS — Z923 Personal history of irradiation: Secondary | ICD-10-CM | POA: Insufficient documentation

## 2015-02-10 DIAGNOSIS — Z8619 Personal history of other infectious and parasitic diseases: Secondary | ICD-10-CM | POA: Insufficient documentation

## 2015-02-10 DIAGNOSIS — E039 Hypothyroidism, unspecified: Secondary | ICD-10-CM | POA: Diagnosis not present

## 2015-02-10 DIAGNOSIS — C50912 Malignant neoplasm of unspecified site of left female breast: Secondary | ICD-10-CM

## 2015-02-10 DIAGNOSIS — K121 Other forms of stomatitis: Secondary | ICD-10-CM | POA: Diagnosis not present

## 2015-02-10 DIAGNOSIS — Z79899 Other long term (current) drug therapy: Secondary | ICD-10-CM | POA: Insufficient documentation

## 2015-02-13 ENCOUNTER — Encounter: Payer: Self-pay | Admitting: Oncology

## 2015-02-13 NOTE — Progress Notes (Signed)
[place] @ [facility] [phone]  Fax:(336) [place]: August 15, 1945  MR#: [id number]  SEG#:[id number]  Patient Care Team: Rusty Aus, MD as PCP - General (Internal Medicine)  CHIEF COMPLAINT:  Chief Complaint  Patient presents with  . Breast Cancer    Oncology History   Chief Complaint/Diagnosis:   70 year old female status post wide local excision and sentinel node biopsy and adjuvant chemotherapy for he pathologic stage I (T1 B. N0 (S. 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  Patient has finished  4 cycles (one cycle with TCH, 3 cycles with Cytoxan and Adriamycin) patient had severe hypersensitivie pneumonitis was considered secondary to Herceptin sore Herceptin was discontinued.  Patient finished chemotherapy  august of 2013 4.finished radiation to the breast   Summary of Treatment   site treated left breast To Ms. 5040 cGy in 28 fractions Start date September 28, 2011 Complete date November 07, 2011 Using 6 MV photons three-dimensional treatment planning     Cancer of left breast ([place])    No flowsheet data found.  INTERVAL HISTORY:  70 year old lady with carcinoma of left breast.  ER negative PR negative HER-2/neu positive.  Patient came for further follow-up.  No chills.  No fever.  No bony pains. GETTING regular mammogram done Patient is here for ongoing evaluation and treatment consideration. Remains asymptomatic.  Problem with the mouth and ulceration continues.  Biopsies were done which had been reported to be negative  REVIEW OF SYSTEMS:   GENERAL:  Feels good.  Active.  No fevers, sweats or weight loss. PERFORMANCE STATUS (ECOG):  0 HEENT:  Patient continues to have multiple ulcers off and on in the mouth.  Etiology of which is not clear.  Patient was given trial with Zovirax without much help . Lungs: No shortness of breath or cough.  No  hemoptysis. Cardiac:  No chest pain, palpitations, orthopnea, or PND. GI:  No nausea, vomiting, diarrhea, constipation, melena or hematochezia. GU:  No urgency, frequency, dysuria, or hematuria. Musculoskeletal:  No back pain.  No joint pain.  No muscle tenderness. Extremities:  No pain or swelling. Skin:  No rashes or skin changes. Neuro:  No headache, numbness or weakness, balance or coordination issues. Endocrine:  No diabetes, thyroid issues, hot flashes or night sweats. Psych:  No mood changes, depression or anxiety. Pain:  No focal pain. Review of systems:  All other systems reviewed and found to be negative. As per HPI. Otherwise, a complete review of systems is negatve.  PAST MEDICAL HISTORY: Past Medical History  Diagnosis Date  . Cancer of left breast ([place]) 08/10/2014    Significant History/PMH:   sepsis:    breast cancer:    Hypothyroidism:    Hyperlipidemia:    mild anemia:    HTN:    lumpectomy: Apr 2013   BTL:   Preventive Screening:  Has patient had any of the following test? Mammography (1)   Last Mammography: December 2014(1)   Smoking History: Smoking History Never Smoked.(1)  PFSH: Family History: positive, sister with breast cancer  Social History: negative alcohol, negative tobacco  Additional Past Medical and Surgical History: Hypercholesterolemia   Hypothyroidism   ADVANCED DIRECTIVES:  Patient does have a living will HEALTH MAINTENANCE: Social History  Substance Use Topics  . Smoking status: Never Smoker   . Smokeless tobacco: None  . Alcohol Use: None      Not on  File  Current Outpatient Prescriptions  Medication Sig Dispense Refill  . Cholecalciferol (VITAMIN D-1000 MAX ST) 1000 UNITS tablet Take by mouth.    . CRESTOR 10 MG tablet     . levothyroxine (SYNTHROID, LEVOTHROID) 100 MCG tablet Take by mouth.     No current facility-administered medications for this visit.    OBJECTIVE:  Filed Vitals:   02/10/15 1534   BP: 114/71  Pulse: 58  Temp: 96.6 F (35.9 C)  Resp: 18     There is no height on file to calculate BMI.    ECOG FS:0 - Asymptomatic  PHYSICAL EXAM: GENERAL:  Well developed, well nourished, sitting comfortably in the exam room in no acute distress. MENTAL STATUS:  Alert and oriented to person, place and time. HEAD:  .  Normocephalic, atraumatic, face symmetric, no Cushingoid features. EYES:  .  Pupils equal round and reactive to light and accomodation.  No conjunctivitis or scleral icterus. ENT:  Oropharynx clear without lesion.  Tongue normal. Mucous membranes moist.  RESPIRATORY:  Clear to auscultation without rales, wheezes or rhonchi. CARDIOVASCULAR:  Regular rate and rhythm without murmur, rub or gallop. BREAST:  Right breast without masses, skin changes or nipple discharge.  Left breast without masses, skin changes or nipple discharge. ABDOMEN:  Soft, non-tender, with active bowel sounds, and no hepatosplenomegaly.  No masses. BACK:  No CVA tenderness.  No tenderness on percussion of the back or rib cage. SKIN:  No rashes, ulcers or lesions. EXTREMITIES: No edema, no skin discoloration or tenderness.  No palpable cords. LYMPH NODES: No palpable cervical, supraclavicular, axillary or inguinal adenopathy  NEUROLOGICAL: Unremarkable. PSYCH:  Appropriate.   LAB RESULTS:  No visits with results within 2 Day(s) from this visit. Latest known visit with results is:  Appointment on 01/22/2015  Component Date Value Ref Range Status  . WBC 01/22/2015 5.3  3.6 - 11.0 K/uL Final  . RBC 01/22/2015 3.95  3.80 - 5.20 MIL/uL Final  . Hemoglobin 01/22/2015 12.3  12.0 - 16.0 g/dL Final  . HCT 01/22/2015 37.1  35.0 - 47.0 % Final  . MCV 01/22/2015 94.1  80.0 - 100.0 fL Final  . MCH 01/22/2015 31.2  26.0 - 34.0 pg Final  . MCHC 01/22/2015 33.2  32.0 - 36.0 g/dL Final  . RDW 01/22/2015 12.9  11.5 - 14.5 % Final  . Platelets 01/22/2015 155  150 - 440 K/uL Final  . Neutrophils Relative %  01/22/2015 59   Final  . Neutro Abs 01/22/2015 3.1  1.4 - 6.5 K/uL Final  . Lymphocytes Relative 01/22/2015 30   Final  . Lymphs Abs 01/22/2015 1.6  1.0 - 3.6 K/uL Final  . Monocytes Relative 01/22/2015 8   Final  . Monocytes Absolute 01/22/2015 0.4  0.2 - 0.9 K/uL Final  . Eosinophils Relative 01/22/2015 3   Final  . Eosinophils Absolute 01/22/2015 0.1  0 - 0.7 K/uL Final  . Basophils Relative 01/22/2015 0   Final  . Basophils Absolute 01/22/2015 0.0  0 - 0.1 K/uL Final  . Sodium 01/22/2015 137  135 - 145 mmol/L Final  . Potassium 01/22/2015 3.5  3.5 - 5.1 mmol/L Final  . Chloride 01/22/2015 103  101 - 111 mmol/L Final  . CO2 01/22/2015 28  22 - 32 mmol/L Final  . Glucose, Bld 01/22/2015 89  65 - 99 mg/dL Final  . BUN 01/22/2015 13  6 - 20 mg/dL Final  . Creatinine, Ser 01/22/2015 0.91  0.44 - 1.00 mg/dL Final  .  Calcium 01/22/2015 9.1  8.9 - 10.3 mg/dL Final  . Total Protein 01/22/2015 6.8  6.5 - 8.1 g/dL Final  . Albumin 01/22/2015 3.9  3.5 - 5.0 g/dL Final  . AST 01/22/2015 23  15 - 41 U/L Final  . ALT 01/22/2015 17  14 - 54 U/L Final  . Alkaline Phosphatase 01/22/2015 78  38 - 126 U/L Final  . Total Bilirubin 01/22/2015 0.6  0.3 - 1.2 mg/dL Final  . GFR calc non Af Amer 01/22/2015 >60  >60 mL/min Final  . GFR calc Af Amer 01/22/2015 >60  >60 mL/min Final   Comment: (NOTE) The eGFR has been calculated using the CKD EPI equation. This calculation has not been validated in all clinical situations. eGFR's persistently <60 mL/min signify possible Chronic Kidney Disease.   . Anion gap 01/22/2015 6  5 - 15 Final  . CA 27.29 01/22/2015 5.2  0.0 - 38.6 U/mL Final   Comment: (NOTE) Bayer Centaur/ACS methodology Performed At: [facility] [address] [place], [place] [id number] Lindon Romp MD VL:[id number]        ASSESSMENT: Carcinoma of left breast stage I.  T1b N0 M0 tumor.  Estrogen receptor negative progesterone receptor negative and HER-2/neu receptor  positive On clinical examination there is no evidence of recurrent disease  MEDICAL DECISION MAKING:  All lab data has been reviewed. Tumor markers are within normal limit 2.  Bilateral diagnostic mammogram has been scheduled Multiples SORES  in the mouth  Which is intermittent.  ENT evaluation and biopsies were negative.  Patient expressed understanding and was in agreement with this plan. She also understands that She can call clinic at any time with any questions, concerns, or complaints.    No matching staging information was found for the patient.  Forest Gleason, MD   02/13/2015 4:20 PM

## 2015-02-25 ENCOUNTER — Ambulatory Visit: Payer: Medicare Other

## 2015-02-25 ENCOUNTER — Other Ambulatory Visit: Payer: Medicare Other

## 2015-02-26 ENCOUNTER — Ambulatory Visit
Admission: RE | Admit: 2015-02-26 | Discharge: 2015-02-26 | Disposition: A | Discharge status: Home / Self Care | Payer: Medicare Other | Source: Ambulatory Visit | Attending: Oncology | Admitting: Oncology

## 2015-02-26 ENCOUNTER — Other Ambulatory Visit: Payer: Self-pay | Admitting: Oncology

## 2015-02-26 DIAGNOSIS — Z853 Personal history of malignant neoplasm of breast: Secondary | ICD-10-CM | POA: Diagnosis not present

## 2015-02-26 DIAGNOSIS — C50912 Malignant neoplasm of unspecified site of left female breast: Secondary | ICD-10-CM

## 2015-02-26 DIAGNOSIS — Z9889 Other specified postprocedural states: Secondary | ICD-10-CM | POA: Diagnosis not present

## 2015-03-25 DIAGNOSIS — E538 Deficiency of other specified B group vitamins: Secondary | ICD-10-CM | POA: Insufficient documentation

## 2015-08-11 ENCOUNTER — Other Ambulatory Visit: Payer: Medicare Other

## 2015-08-18 ENCOUNTER — Inpatient Hospital Stay: Payer: Medicare Other | Attending: Oncology

## 2015-08-18 DIAGNOSIS — K1370 Unspecified lesions of oral mucosa: Secondary | ICD-10-CM | POA: Diagnosis not present

## 2015-08-18 DIAGNOSIS — Z9221 Personal history of antineoplastic chemotherapy: Secondary | ICD-10-CM | POA: Insufficient documentation

## 2015-08-18 DIAGNOSIS — Z923 Personal history of irradiation: Secondary | ICD-10-CM | POA: Insufficient documentation

## 2015-08-18 DIAGNOSIS — Z803 Family history of malignant neoplasm of breast: Secondary | ICD-10-CM | POA: Diagnosis not present

## 2015-08-18 DIAGNOSIS — Z853 Personal history of malignant neoplasm of breast: Secondary | ICD-10-CM | POA: Diagnosis present

## 2015-08-18 DIAGNOSIS — C50912 Malignant neoplasm of unspecified site of left female breast: Secondary | ICD-10-CM

## 2015-08-18 LAB — COMPREHENSIVE METABOLIC PANEL
ALBUMIN: 4.1 g/dL (ref 3.5–5.0)
ALK PHOS: 75 U/L (ref 38–126)
ALT: 18 U/L (ref 14–54)
AST: 22 U/L (ref 15–41)
Anion gap: 4 — ABNORMAL LOW (ref 5–15)
BILIRUBIN TOTAL: 0.9 mg/dL (ref 0.3–1.2)
BUN: 16 mg/dL (ref 6–20)
CALCIUM: 9.1 mg/dL (ref 8.9–10.3)
CO2: 28 mmol/L (ref 22–32)
Chloride: 107 mmol/L (ref 101–111)
Creatinine, Ser: 0.72 mg/dL (ref 0.44–1.00)
GFR calc Af Amer: >60 mL/min (ref >60)
GLUCOSE: 103 mg/dL — AB (ref 65–99)
Potassium: 4.3 mmol/L (ref 3.5–5.1)
Sodium: 139 mmol/L (ref 135–145)
Total Protein: 6.8 g/dL (ref 6.5–8.1)

## 2015-08-18 LAB — CBC WITH DIFFERENTIAL/PLATELET
BASOS ABS: 0 10*3/uL (ref 0–0.1)
BASOS PCT: 0 %
Eosinophils Absolute: 0.1 10*3/uL (ref 0–0.7)
Eosinophils Relative: 2 %
HEMATOCRIT: 35 % (ref 35.0–47.0)
HEMOGLOBIN: 12.3 g/dL (ref 12.0–16.0)
LYMPHS PCT: 35 %
Lymphs Abs: 2 10*3/uL (ref 1.0–3.6)
MCH: 32.6 pg (ref 26.0–34.0)
MCHC: 35 g/dL (ref 32.0–36.0)
MCV: 93.1 fL (ref 80.0–100.0)
MONO ABS: 0.6 10*3/uL (ref 0.2–0.9)
MONOS PCT: 11 %
NEUTROS ABS: 2.9 10*3/uL (ref 1.4–6.5)
NEUTROS PCT: 52 %
Platelets: 143 10*3/uL — ABNORMAL LOW (ref 150–440)
RBC: 3.76 MIL/uL — ABNORMAL LOW (ref 3.80–5.20)
RDW: 12.8 % (ref 11.5–14.5)
WBC: 5.6 10*3/uL (ref 3.6–11.0)

## 2015-08-19 LAB — CANCER ANTIGEN 27.29: CA 27.29: 10.1 U/mL (ref 0.0–38.6)

## 2015-08-25 ENCOUNTER — Encounter: Payer: Self-pay | Admitting: Oncology

## 2015-08-25 ENCOUNTER — Inpatient Hospital Stay (HOSPITAL_BASED_OUTPATIENT_CLINIC_OR_DEPARTMENT_OTHER): Payer: Medicare Other | Admitting: Oncology

## 2015-08-25 VITALS — BP 95/58 | HR 57 | Temp 96.2°F | Resp 17 | Ht 66.0 in | Wt 166.1 lb

## 2015-08-25 DIAGNOSIS — C50912 Malignant neoplasm of unspecified site of left female breast: Secondary | ICD-10-CM

## 2015-08-25 DIAGNOSIS — Z9221 Personal history of antineoplastic chemotherapy: Secondary | ICD-10-CM | POA: Diagnosis not present

## 2015-08-25 DIAGNOSIS — K1370 Unspecified lesions of oral mucosa: Secondary | ICD-10-CM | POA: Diagnosis not present

## 2015-08-25 DIAGNOSIS — Z923 Personal history of irradiation: Secondary | ICD-10-CM | POA: Diagnosis not present

## 2015-08-25 DIAGNOSIS — Z853 Personal history of malignant neoplasm of breast: Secondary | ICD-10-CM

## 2015-08-25 DIAGNOSIS — Z803 Family history of malignant neoplasm of breast: Secondary | ICD-10-CM

## 2015-08-25 NOTE — Progress Notes (Signed)
[place]  [phone]  Fax:(336) [phone]     Michelle Johns DOB: 04/19/1945  MR#: [id number]  SWF#:[id number]  Patient Care Team: Rusty Aus, MD as PCP - General (Internal Medicine) Leonie Green, MD as Referring Physician (Surgery)  CHIEF COMPLAINT:  Chief Complaint  Patient presents with  . Follow-up    Breast Cancer    INTERVAL HISTORY: Patient returns to clinic for 6 month follow up of left breast carcinoma, ER/PR negative, HER2 positive. She currently feels well and her only complaint is chronic mouth sores. She had biopsies done on the mouth sores which were negative. She otherwise has no complaints and no new symptoms since her last visit in January 2017 with Dr Oliva Bustard.   REVIEW OF SYSTEMS:   Review of Systems  Constitutional: Negative.   HENT:       Mouth sores  Eyes: Negative.   Respiratory: Negative.   Cardiovascular: Negative.   Gastrointestinal: Negative.   Genitourinary: Negative.   Musculoskeletal: Negative.   Skin: Negative.   Neurological: Negative.   Psychiatric/Behavioral: Negative.     As per HPI. Otherwise, a complete review of systems is negatve.  ONCOLOGY HISTORY: Oncology History   Chief Complaint/Diagnosis:   70 year old female status post wide local excision and sentinel node biopsy and adjuvant chemotherapy for he pathologic stage I (T1 B. N0 (S. 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  Patient has finished  4 cycles (one cycle with TCH, 3 cycles with Cytoxan and Adriamycin) patient had severe hypersensitivie pneumonitis was considered secondary to Herceptin sore Herceptin was discontinued.  Patient finished chemotherapy  august of 2013 4.finished radiation to the breast   Summary of Treatment   site treated left breast To Ms. 5040 cGy in 28 fractions Start date September 28, 2011 Complete date November 07, 2011 Using 6 MV  photons three-dimensional treatment planning     Cancer of left breast [facility])    PAST MEDICAL HISTORY: Past Medical History  Diagnosis Date  . Cancer of left breast ([place])     left 2013 with chemo and radiation therapy    PAST SURGICAL HISTORY: Past Surgical History  Procedure Laterality Date  . Breast excisional biopsy Left     2013 with chemo and radiation therapy    FAMILY HISTORY Family History  Problem Relation Age of Onset  . Breast cancer Sister 51    GYNECOLOGIC HISTORY:  No LMP recorded.     ADVANCED DIRECTIVES:    HEALTH MAINTENANCE: Social History  Substance Use Topics  . Smoking status: Never Smoker   . Smokeless tobacco: Not on file  . Alcohol Use: No    Not on File  Current Outpatient Prescriptions  Medication Sig Dispense Refill  . Cholecalciferol (VITAMIN D-1000 MAX ST) 1000 UNITS tablet Take by mouth.    . CRESTOR 10 MG tablet     . levothyroxine (SYNTHROID, LEVOTHROID) 100 MCG tablet Take by mouth.     No current facility-administered medications for this visit.    OBJECTIVE: BP 95/58 mmHg  Pulse 57  Temp(Src) 96.2 F (35.7 C) (Tympanic)  Resp 17  Ht _0  (1.676 m)  Wt 166 lb 1.9 oz (75.35 kg)  BMI 26.82 kg/m2   Body mass index is 26.82 kg/(m^2).    ECOG FS:0 - Asymptomatic  General: Well-developed, well-nourished, no acute distress. Accompanied by husband Eyes: Pink conjunctiva, anicteric sclera. HEENT: Normocephalic, moist mucous membranes, clear oropharnyx.  Lungs: Clear to auscultation bilaterally. Heart: Regular rate and rhythm. No rubs, murmurs, or gallops. Abdomen: Soft, nontender, nondistended. No organomegaly noted, normoactive bowel sounds. Breast: Declined today, done by Dr Sabra Heck  Musculoskeletal: No edema, cyanosis, or clubbing. Neuro: Alert, answering all questions appropriately. Cranial nerves grossly intact. Skin: No rashes or petechiae noted. Psych: Normal affect. Lymphatics: No cervical, calvicular, axillary  LAD.   LAB RESULTS:  No visits with results within 3 Day(s) from this visit. Latest known visit with results is:  Appointment on 08/18/2015  Component Date Value Ref Range Status  . WBC 08/18/2015 5.6  3.6 - 11.0 K/uL Final  . RBC 08/18/2015 3.76* 3.80 - 5.20 MIL/uL Final  . Hemoglobin 08/18/2015 12.3  12.0 - 16.0 g/dL Final  . HCT 08/18/2015 35.0  35.0 - 47.0 % Final  . MCV 08/18/2015 93.1  80.0 - 100.0 fL Final  . MCH 08/18/2015 32.6  26.0 - 34.0 pg Final  . MCHC 08/18/2015 35.0  32.0 - 36.0 g/dL Final  . RDW 08/18/2015 12.8  11.5 - 14.5 % Final  . Platelets 08/18/2015 143* 150 - 440 K/uL Final  . Neutrophils Relative % 08/18/2015 52   Final  . Neutro Abs 08/18/2015 2.9  1.4 - 6.5 K/uL Final  . Lymphocytes Relative 08/18/2015 35   Final  . Lymphs Abs 08/18/2015 2.0  1.0 - 3.6 K/uL Final  . Monocytes Relative 08/18/2015 11   Final  . Monocytes Absolute 08/18/2015 0.6  0.2 - 0.9 K/uL Final  . Eosinophils Relative 08/18/2015 2   Final  . Eosinophils Absolute 08/18/2015 0.1  0 - 0.7 K/uL Final  . Basophils Relative 08/18/2015 0   Final  . Basophils Absolute 08/18/2015 0.0  0 - 0.1 K/uL Final  . Sodium 08/18/2015 139  135 - 145 mmol/L Final  . Potassium 08/18/2015 4.3  3.5 - 5.1 mmol/L Final  . Chloride 08/18/2015 107  101 - 111 mmol/L Final  . CO2 08/18/2015 28  22 - 32 mmol/L Final  . Glucose, Bld 08/18/2015 103* 65 - 99 mg/dL Final  . BUN 08/18/2015 16  6 - 20 mg/dL Final  . Creatinine, Ser 08/18/2015 0.72  0.44 - 1.00 mg/dL Final  . Calcium 08/18/2015 9.1  8.9 - 10.3 mg/dL Final  . Total Protein 08/18/2015 6.8  6.5 - 8.1 g/dL Final  . Albumin 08/18/2015 4.1  3.5 - 5.0 g/dL Final  . AST 08/18/2015 22  15 - 41 U/L Final  . ALT 08/18/2015 18  14 - 54 U/L Final  . Alkaline Phosphatase 08/18/2015 75  38 - 126 U/L Final  . Total Bilirubin 08/18/2015 0.9  0.3 - 1.2 mg/dL Final  . GFR calc non Af Amer 08/18/2015 >60  >60 mL/min Final  . GFR calc Af Amer 08/18/2015 >60  >60 mL/min  Final   Comment: (NOTE) The eGFR has been calculated using the CKD EPI equation. This calculation has not been validated in all clinical situations. eGFR's persistently <60 mL/min signify possible Chronic Kidney Disease.   . Anion gap 08/18/2015 4* 5 - 15 Final  . CA 27.29 08/18/2015 10.1  0.0 - 38.6 U/mL Final   Comment: (NOTE) Bayer Centaur/ACS methodology Performed At: [facility] [place], [place] [id number] Lindon Romp MD WU:[id number]     STUDIES: No results found.  ASSESSMENT & PLAN:    1. Breast Cancer- Stage 1, T1b, N0, M0 ER/PR negative, HER2/neu positive. No clinical evidence of recurrence. Her last tumor marker is  WNL at 10.1. Continue follow up in 6 months with repeat lab. Mammogram in January 2017 was Birads2- repeat in January 2018.  2. Mouth sores- ENT evaluation and biopsies were negative.  Patient expressed understanding and was in agreement with this plan. She also understands that She can call clinic at any time with any questions, concerns, or complaints.   Dr. Grayland Ormond was available for consultation and review of plan of care for this patient.   Mayra Reel, NP   08/25/2015 12:13 PM

## 2015-08-25 NOTE — Progress Notes (Signed)
Pt reports ongoing mouth sore that started when she started treatment.  Dukes mouthwash not make it go away.  No other concerns.

## 2016-02-22 ENCOUNTER — Other Ambulatory Visit: Payer: Medicare Other

## 2016-02-24 ENCOUNTER — Inpatient Hospital Stay: Payer: Medicare Other

## 2016-02-25 ENCOUNTER — Inpatient Hospital Stay: Payer: Medicare Other | Attending: Oncology

## 2016-02-25 DIAGNOSIS — Z1231 Encounter for screening mammogram for malignant neoplasm of breast: Secondary | ICD-10-CM | POA: Diagnosis not present

## 2016-02-25 DIAGNOSIS — Z9221 Personal history of antineoplastic chemotherapy: Secondary | ICD-10-CM | POA: Diagnosis not present

## 2016-02-25 DIAGNOSIS — Z803 Family history of malignant neoplasm of breast: Secondary | ICD-10-CM | POA: Diagnosis not present

## 2016-02-25 DIAGNOSIS — Z853 Personal history of malignant neoplasm of breast: Secondary | ICD-10-CM | POA: Diagnosis not present

## 2016-02-25 DIAGNOSIS — Z79899 Other long term (current) drug therapy: Secondary | ICD-10-CM | POA: Insufficient documentation

## 2016-02-25 DIAGNOSIS — Z923 Personal history of irradiation: Secondary | ICD-10-CM | POA: Insufficient documentation

## 2016-02-25 DIAGNOSIS — C50912 Malignant neoplasm of unspecified site of left female breast: Secondary | ICD-10-CM

## 2016-02-26 LAB — CANCER ANTIGEN 27.29: CA 27.29: 10.7 U/mL (ref 0.0–38.6)

## 2016-02-28 ENCOUNTER — Other Ambulatory Visit: Payer: Medicare Other

## 2016-02-28 ENCOUNTER — Ambulatory Visit: Payer: Medicare Other

## 2016-02-29 ENCOUNTER — Ambulatory Visit: Payer: Medicare Other | Admitting: Oncology

## 2016-02-29 ENCOUNTER — Other Ambulatory Visit: Payer: Medicare Other

## 2016-03-01 NOTE — Progress Notes (Signed)
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  INTERVAL HISTORY: Patient returns to clinic today for routine six-month follow-up. She continues to feel well and remains asymptomatic. She has no neurologic complaints. She denies any recent fevers or illnesses. She has a good appetite and denies weight loss. She has no chest pain or shortness of breath. She denies any nausea, vomiting, constipation, or diarrhea. She has no urinary complaints. Patient feels at her baseline and offers no specific complaints today.   REVIEW OF SYSTEMS:   Review of Systems  Constitutional: Negative.  Negative for fever, malaise/fatigue and weight loss.  Respiratory: Negative.  Negative for cough and shortness of breath.   Cardiovascular: Negative.  Negative for chest pain and leg swelling.  Gastrointestinal: Negative.  Negative for abdominal pain.  Genitourinary: Negative.   Musculoskeletal: Negative.   Neurological: Negative.  Negative for weakness.  Psychiatric/Behavioral: Negative.  The patient is not nervous/anxious.     As per HPI. Otherwise, a complete review of systems is negative.  ONCOLOGY HISTORY: Oncology History   Chief Complaint/Diagnosis:   71 year old female status post wide local excision and sentinel node biopsy and adjuvant chemotherapy for he pathologic stage I (T1 B. N0 (S. 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  Patient finished chemotherapy  august of 2013 4.finished radiation to the breast   Summary of Treatment   site treated left breast To Ms. 5040 cGy in 28 fractions Start date September 28, 2011 Complete date November 07, 2011 Using 6 MV photons three-dimensional treatment planning     Cancer of left breast [facility])    PAST MEDICAL HISTORY: Past Medical History:  Diagnosis Date  . Breast cancer ([place]) 2013   left breast cancer  . Cancer of left breast ([place])    left 2013 with chemo and radiation therapy    PAST SURGICAL HISTORY: Past Surgical History:  Procedure Laterality Date  . BREAST EXCISIONAL BIOPSY Left 2013   positive,2013 with chemo and radiation therapy    FAMILY HISTORY Family History  Problem Relation Age of Onset  . Breast cancer Sister 52    GYNECOLOGIC HISTORY:  No LMP recorded. Patient is postmenopausal.     ADVANCED DIRECTIVES:    HEALTH MAINTENANCE: Social History  Substance Use Topics  . Smoking status: Never Smoker  . Smokeless tobacco: Not on file  . Alcohol use No    No Known Allergies  Current Outpatient Prescriptions  Medication Sig Dispense Refill  . Cholecalciferol (VITAMIN D-1000 MAX ST) 1000 UNITS tablet Take by mouth.    . CRESTOR 10 MG tablet     . levothyroxine (SYNTHROID, LEVOTHROID) 100 MCG tablet Take by mouth.     No current facility-administered medications for this visit.     OBJECTIVE: BP 109/71 (BP Location: Right Arm, Patient Position: Sitting)   Pulse (!) 56   Temp 97.6 F (36.4 C) (Tympanic)   Resp 18   Wt 166 lb 0.1  oz (75.3 kg)   BMI 26.79 kg/m    Body mass index is 26.79 kg/m.    ECOG FS:0 - Asymptomatic  General: Well-developed, well-nourished, no acute distress. Accompanied by husband Eyes: Pink conjunctiva, anicteric sclera. Breasts: Patient declined exam today. Lungs: Clear to auscultation bilaterally. Heart: Regular rate and rhythm. No  rubs, murmurs, or gallops. Abdomen: Soft, nontender, nondistended. No organomegaly noted, normoactive bowel sounds. Musculoskeletal: No edema, cyanosis, or clubbing. Neuro: Alert, answering all questions appropriately. Cranial nerves grossly intact. Skin: No rashes or petechiae noted. Psych: Normal affect.   LAB RESULTS:  No visits with results within 3 Day(s) from this visit.  Latest known visit with results is:  Appointment on 02/25/2016  Component Date Value Ref Range Status  . CA 27.29 02/25/2016 10.7  0.0 - 38.6 U/mL Final   Comment: (NOTE) Bayer Centaur/ACS methodology Performed At: [facility] [place], [place] [id number] Lindon Romp MD KG:[id number]     STUDIES: Mm Diag Breast Tomo Bilateral  Result Date: 03/02/2016 CLINICAL DATA:  71 year old female with history of left lumpectomy in 2013. EXAM: 2D DIGITAL DIAGNOSTIC BILATERAL MAMMOGRAM WITH CAD AND ADJUNCT TOMO COMPARISON:  Previous exam(s). ACR Breast Density Category a: The breast tissue is almost entirely fatty. FINDINGS: Posttreatment changes are again seen of the upper, outer left breast. No suspicious mass, calcifications, or other abnormality is identified within either breast. Mammographic images were processed with CAD. IMPRESSION: No mammographic evidence of malignancy. RECOMMENDATION: Bilateral diagnostic mammogram in 1 year. I have discussed the findings and recommendations with the patient. Results were also provided in writing at the conclusion of the visit. If applicable, a reminder letter will be sent to the patient regarding the next appointment. BI-RADS CATEGORY  2: Benign. Electronically Signed   By: Pamelia Hoit M.D.   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  1. 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  No evidence of disease. Patient completed her chemotherapy in August 2013. Her most recent mammogram on March 02, 2016 was reported as BI-RADS 2, repeat in one year. Patient did not require an aromatase inhibitor given the ER/PR status of her tumor. No intervention is needed at this time. Return to clinic in 6 months with repeat laboratory work and further evaluation. Patient can likely be switched to yearly visits at this point.   Patient expressed understanding and was in agreement with this plan. She also understands that She can call clinic at any time with any questions, concerns, or complaints.    Lloyd Huger, MD   03/06/2016 8:57 AM

## 2016-03-02 ENCOUNTER — Other Ambulatory Visit: Payer: Self-pay | Admitting: Oncology

## 2016-03-02 ENCOUNTER — Ambulatory Visit
Admission: RE | Admit: 2016-03-02 | Discharge: 2016-03-02 | Disposition: A | Discharge status: Home / Self Care | Payer: Medicare Other | Source: Ambulatory Visit | Attending: Oncology | Admitting: Oncology

## 2016-03-02 ENCOUNTER — Other Ambulatory Visit: Payer: Medicare Other

## 2016-03-02 ENCOUNTER — Inpatient Hospital Stay (HOSPITAL_BASED_OUTPATIENT_CLINIC_OR_DEPARTMENT_OTHER): Payer: Medicare Other | Admitting: Oncology

## 2016-03-02 VITALS — BP 109/71 | HR 56 | Temp 97.6°F | Resp 18 | Wt 166.0 lb

## 2016-03-02 DIAGNOSIS — C50912 Malignant neoplasm of unspecified site of left female breast: Secondary | ICD-10-CM

## 2016-03-02 DIAGNOSIS — Z923 Personal history of irradiation: Secondary | ICD-10-CM | POA: Diagnosis not present

## 2016-03-02 DIAGNOSIS — Z79899 Other long term (current) drug therapy: Secondary | ICD-10-CM

## 2016-03-02 DIAGNOSIS — Z853 Personal history of malignant neoplasm of breast: Secondary | ICD-10-CM

## 2016-03-02 DIAGNOSIS — Z9221 Personal history of antineoplastic chemotherapy: Secondary | ICD-10-CM | POA: Diagnosis not present

## 2016-03-02 DIAGNOSIS — Z171 Estrogen receptor negative status [ER-]: Principal | ICD-10-CM

## 2016-03-02 DIAGNOSIS — Z803 Family history of malignant neoplasm of breast: Secondary | ICD-10-CM

## 2016-03-02 DIAGNOSIS — Z1231 Encounter for screening mammogram for malignant neoplasm of breast: Secondary | ICD-10-CM

## 2016-03-02 HISTORY — DX: Malignant neoplasm of unspecified site of unspecified female breast: C50.919

## 2016-05-25 IMAGING — MG MM DIAG BREAST TOMO BILATERAL
8 of 13 series · 8 of 29 positions shown · non-contrast
Comparison: Previous exam(s).

CLINICAL DATA: Annual diagnostic mammography. Patient underwent
left lumpectomy breast carcinoma in 2238. No current complaints.

EXAM:
DIGITAL DIAGNOSTIC BILATERAL MAMMOGRAM WITH 3D TOMOSYNTHESIS AND CAD

[L MLO (1 of 2)]
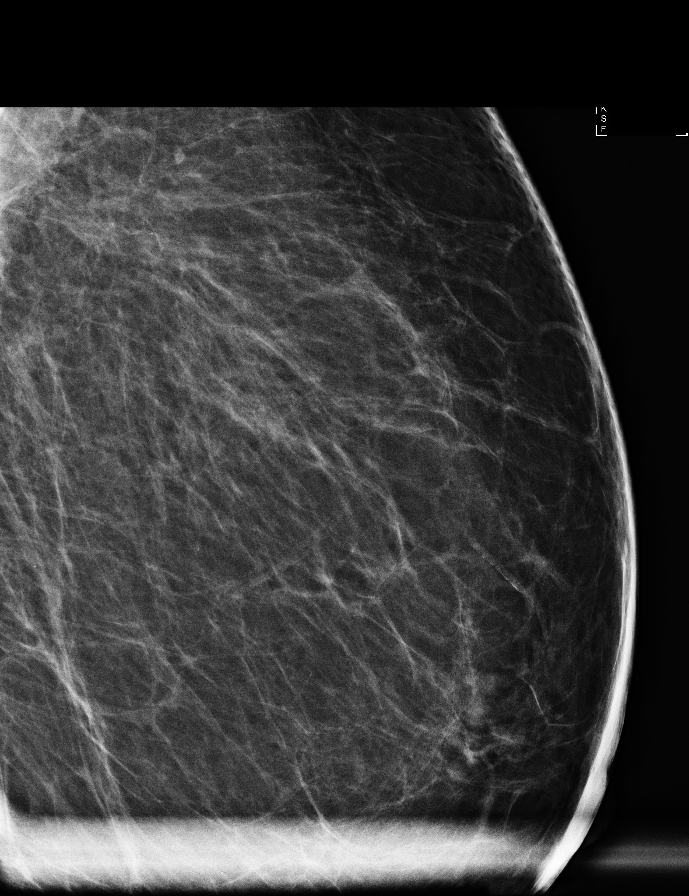

[R CC]
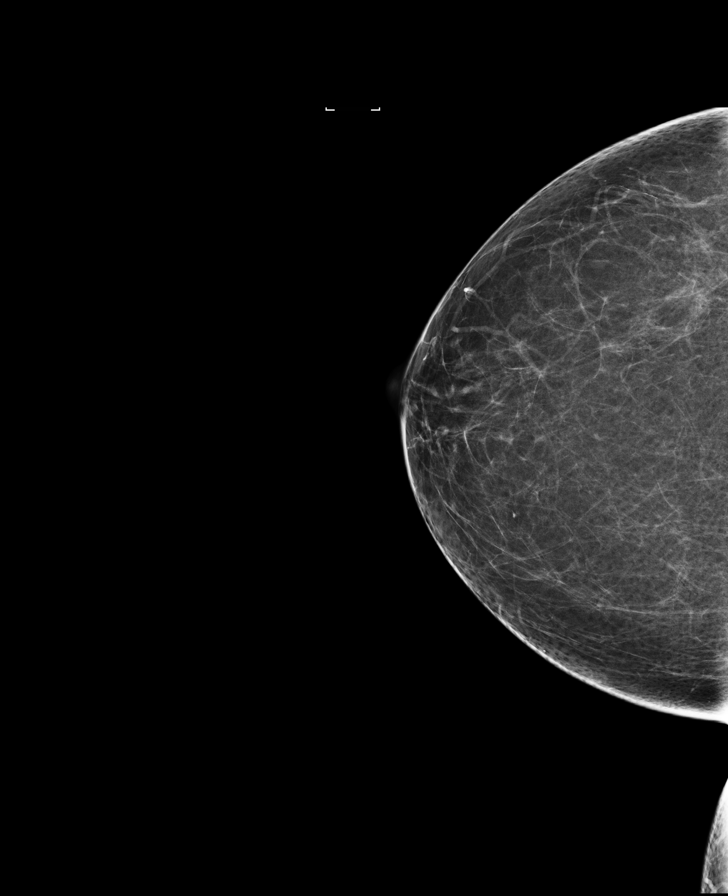

[L CC]
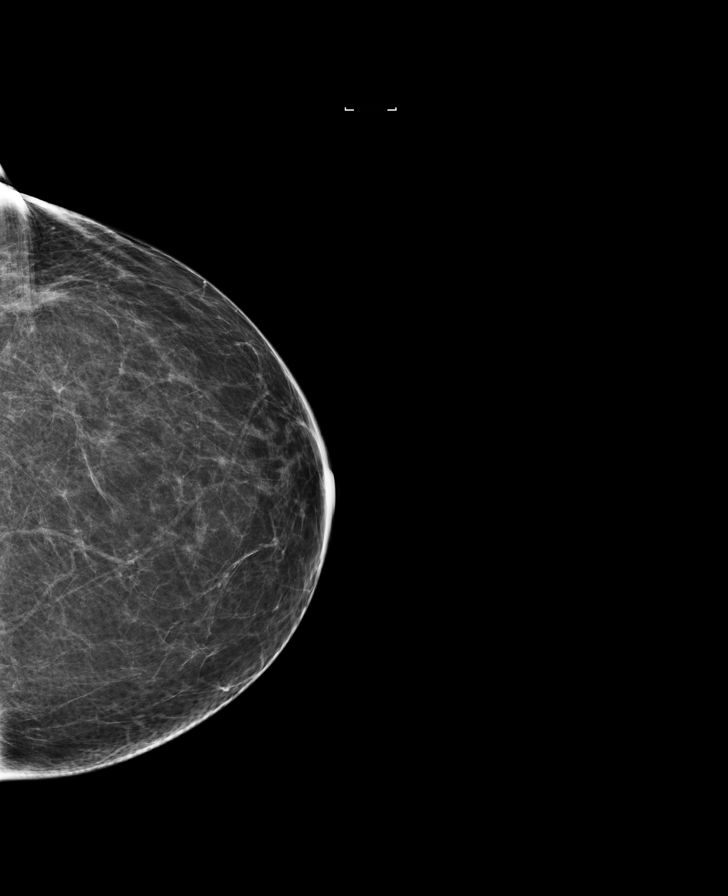

[R MLO synth-2D]
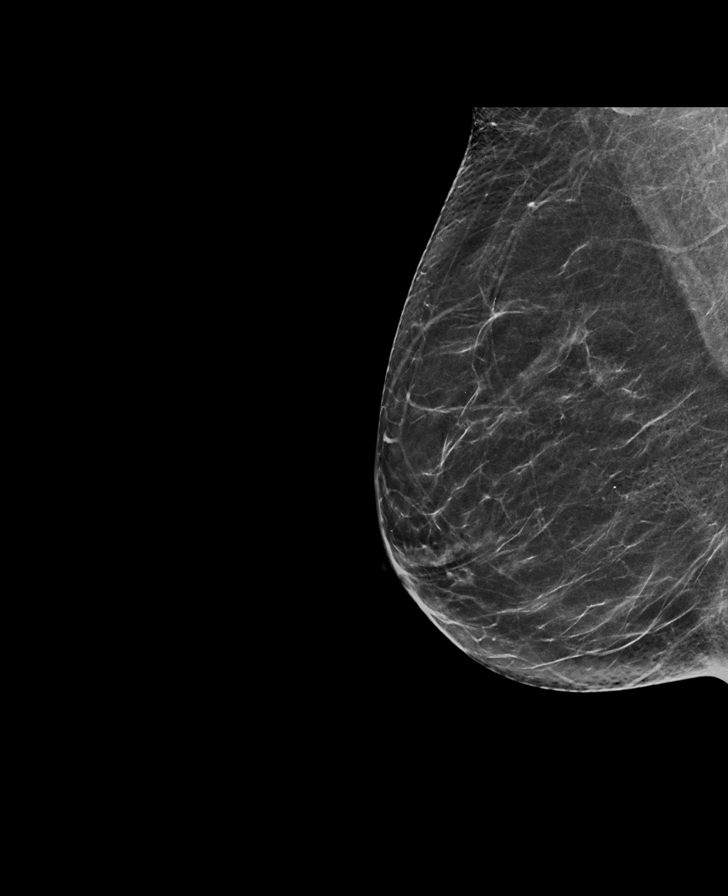

[L MLO synth-2D]
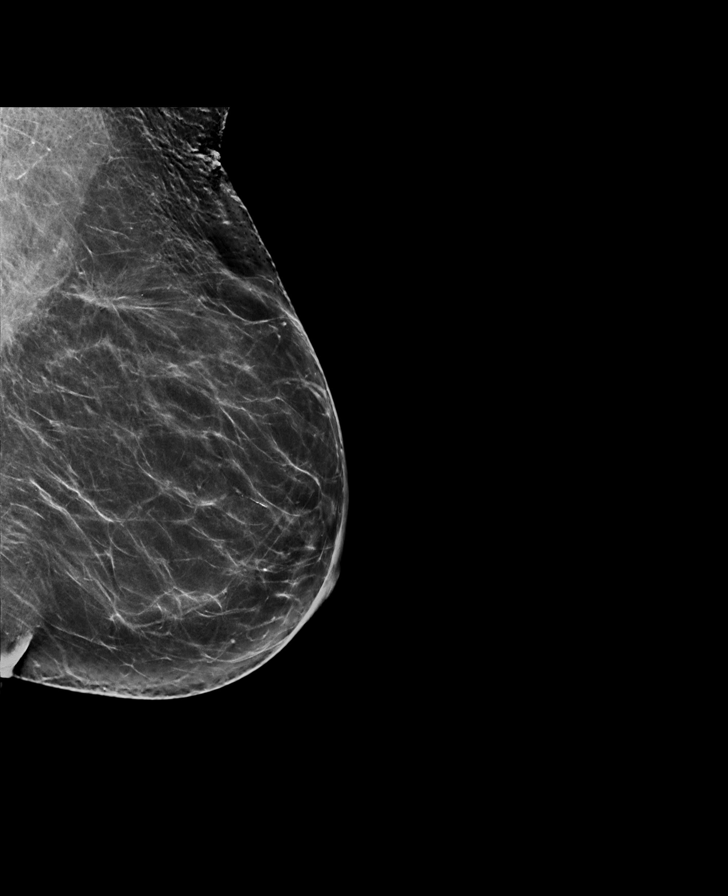

[L MLO (2 of 2)]
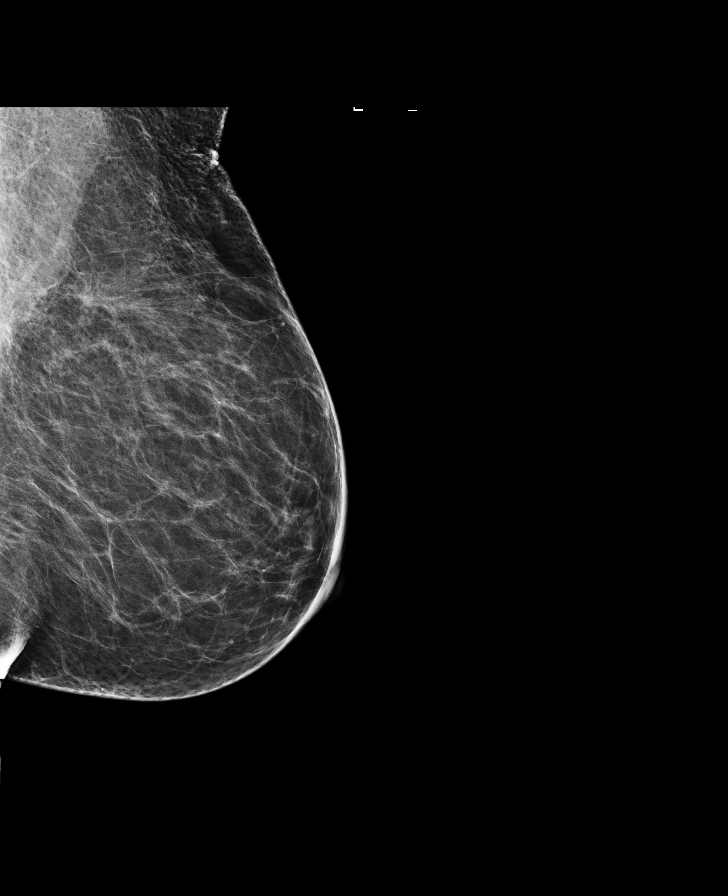

[L CC synth-2D]
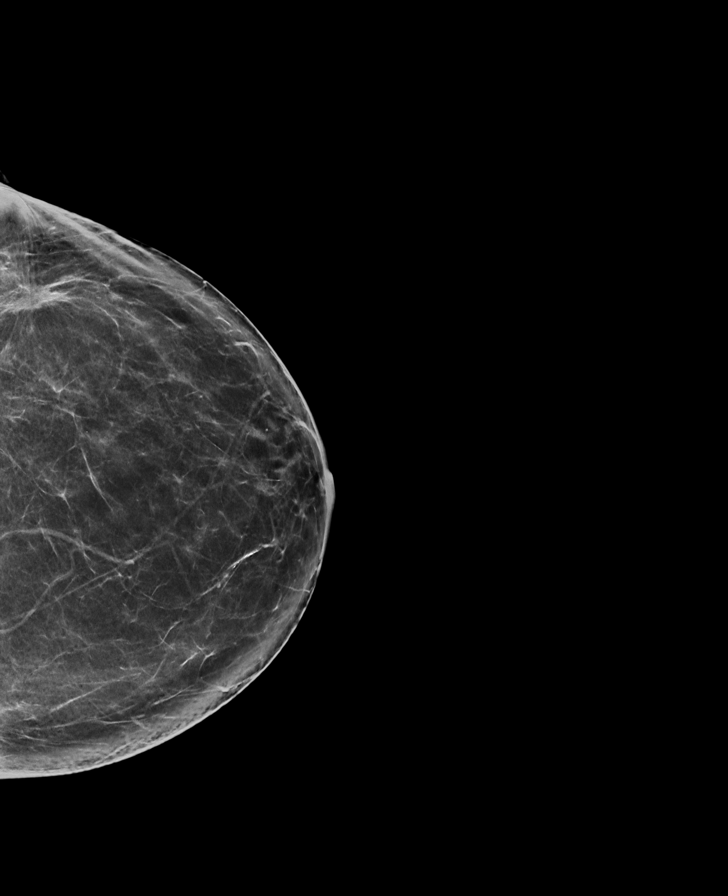

[R MLO]
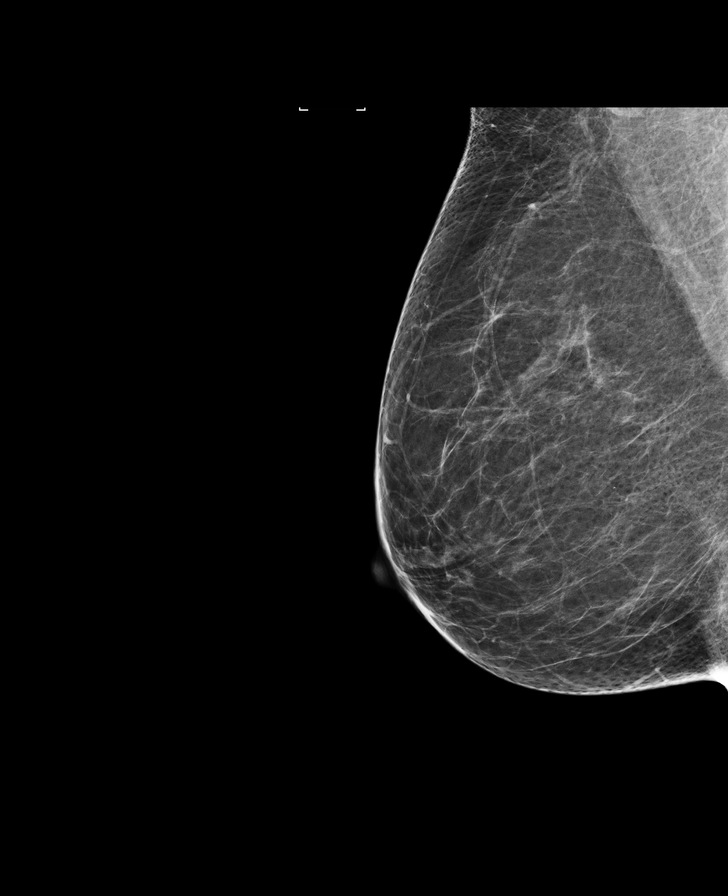

[8 of 29 positions shown; findings below may reference images not displayed]

ACR Breast Density Category b: There are scattered areas of
fibroglandular density.
FINDINGS: Architectural distortion reflecting postsurgical scarring in the
posterior upper outer left breast is stable from the most recent
prior exam. There are no discrete masses, other areas of
architectural distortion or suspicious calcifications.

Mammographic images were processed with CAD.
IMPRESSION: No evidence of recurrent or new breast malignancy. Benign
postsurgical changes on the left.

RECOMMENDATION:
Diagnostic mammography in 1 year per standard post lumpectomy
protocol.

I have discussed the findings and recommendations with the patient.
Results were also provided in writing at the conclusion of the
visit. If applicable, a reminder letter will be sent to the patient
regarding the next appointment.

BI-RADS CATEGORY  2: Benign.

## 2016-08-30 ENCOUNTER — Inpatient Hospital Stay: Payer: Medicare Other | Attending: Oncology

## 2016-08-30 DIAGNOSIS — Z853 Personal history of malignant neoplasm of breast: Secondary | ICD-10-CM | POA: Insufficient documentation

## 2016-08-30 DIAGNOSIS — C50912 Malignant neoplasm of unspecified site of left female breast: Secondary | ICD-10-CM

## 2016-08-30 DIAGNOSIS — Z171 Estrogen receptor negative status [ER-]: Secondary | ICD-10-CM

## 2016-08-31 LAB — CANCER ANTIGEN 27.29: CAN 27.29: 6.8 U/mL (ref 0.0–38.6)

## 2016-09-06 ENCOUNTER — Ambulatory Visit: Payer: Medicare Other | Admitting: Oncology

## 2016-09-12 ENCOUNTER — Ambulatory Visit: Admit: 2016-09-12 | Payer: Medicare Other | Admitting: Gastroenterology

## 2016-09-12 SURGERY — COLONOSCOPY
Anesthesia: General

## 2016-09-13 ENCOUNTER — Inpatient Hospital Stay: Payer: Medicare Other | Attending: Oncology | Admitting: Oncology

## 2016-09-13 ENCOUNTER — Encounter: Payer: Self-pay | Admitting: Oncology

## 2016-09-13 VITALS — BP 121/72 | HR 61 | Temp 97.9°F | Wt 165.7 lb

## 2016-09-13 DIAGNOSIS — Z171 Estrogen receptor negative status [ER-]: Secondary | ICD-10-CM

## 2016-09-13 DIAGNOSIS — Z923 Personal history of irradiation: Secondary | ICD-10-CM | POA: Insufficient documentation

## 2016-09-13 DIAGNOSIS — Z9221 Personal history of antineoplastic chemotherapy: Secondary | ICD-10-CM | POA: Insufficient documentation

## 2016-09-13 DIAGNOSIS — Z853 Personal history of malignant neoplasm of breast: Secondary | ICD-10-CM | POA: Diagnosis present

## 2016-09-13 DIAGNOSIS — Z79899 Other long term (current) drug therapy: Secondary | ICD-10-CM | POA: Diagnosis not present

## 2016-09-13 DIAGNOSIS — C50912 Malignant neoplasm of unspecified site of left female breast: Secondary | ICD-10-CM

## 2016-09-13 NOTE — Progress Notes (Signed)
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  INTERVAL HISTORY: Patient returns to clinic today for routine six-month follow-up. She continues to feel well and remains asymptomatic. She has no neurologic complaints. She denies any recent fevers or illnesses. She has a good appetite and denies weight loss. She has no chest pain or shortness of breath. She denies any nausea, vomiting, constipation, or diarrhea. She has no urinary complaints. Patient feels at her baseline and offers no specific complaints today.   REVIEW OF SYSTEMS:   Review of Systems  Constitutional: Negative.  Negative for fever, malaise/fatigue and weight loss.  Respiratory: Negative.  Negative for cough and shortness of breath.   Cardiovascular: Negative.  Negative for chest pain and leg swelling.  Gastrointestinal: Negative.  Negative for abdominal pain.  Genitourinary: Negative.   Musculoskeletal: Negative.   Skin: Negative.   Neurological: Negative.  Negative for sensory change and weakness.  Psychiatric/Behavioral: Negative.  The patient is not nervous/anxious.     As per HPI. Otherwise, a complete review of systems is negative.  ONCOLOGY HISTORY: Oncology History   Chief Complaint/Diagnosis:   71 year old female status post wide local excision and sentinel node biopsy and adjuvant chemotherapy for he pathologic stage I (T1 B. N0 (S. 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  Patient has finished  4 cycles (one cycle with TCH, 3 cycles with Cytoxan and Adriamycin) patient had  severe hypersensitivie pneumonitis was considered secondary to Herceptin sore Herceptin was discontinued.  Patient finished chemotherapy  august of 2013 4.finished radiation to the breast   Summary of Treatment   site treated left breast To Ms. 5040 cGy in 28 fractions Start date September 28, 2011 Complete date November 07, 2011 Using 6 MV photons three-dimensional treatment planning     Cancer [facility] left breast [facility])    PAST MEDICAL HISTORY: Past Medical History:  Diagnosis Date  . Breast cancer ([place]) 2013   left breast cancer  . Cancer of left breast ([place])    left 2013 with chemo and radiation therapy    PAST SURGICAL HISTORY: Past Surgical History:  Procedure Laterality Date  . BREAST EXCISIONAL BIOPSY Left 2013   positive,2013 with chemo and radiation therapy    FAMILY HISTORY Family History  Problem Relation Age of Onset  . Breast cancer Sister 52    GYNECOLOGIC HISTORY:  No LMP recorded. Patient is postmenopausal.     ADVANCED DIRECTIVES:    HEALTH MAINTENANCE: Social History  Substance Use Topics  . Smoking status: Never Smoker  . Smokeless tobacco: Not on file  . Alcohol use No    No Known Allergies  Current Outpatient Prescriptions  Medication Sig Dispense Refill  . Cholecalciferol (VITAMIN D-1000 MAX ST) 1000 UNITS tablet Take by mouth.    . CRESTOR 10 MG tablet     . levothyroxine (SYNTHROID, LEVOTHROID) 100 MCG tablet Take by mouth.     No current facility-administered medications for this visit.     OBJECTIVE: There were no vitals taken for this visit.   There is no height or weight on file to calculate BMI.    ECOG  FS:0 - Asymptomatic  General: Well-developed, well-nourished, no acute distress. Accompanied by husband Eyes: Pink conjunctiva, anicteric sclera. Breasts: Bilateral breast and axilla without lumps or masses. Lungs: Clear to auscultation bilaterally. Heart: Regular rate and rhythm. No rubs, murmurs, or gallops. Abdomen: Soft,  nontender, nondistended. No organomegaly noted, normoactive bowel sounds. Musculoskeletal: No edema, cyanosis, or clubbing. Neuro: Alert, answering all questions appropriately. Cranial nerves grossly intact. Skin: No rashes or petechiae noted. Psych: Normal affect.   LAB RESULTS:  No visits with results within 3 Day(s) from this visit.  Latest known visit with results is:  Appointment on 08/30/2016  Component Date Value Ref Range Status  . CA 27.29 08/30/2016 6.8  0.0 - 38.6 U/mL Final   Comment: (NOTE) Bayer Centaur/ACS methodology Performed At: [facility] [place], [place] [id number] Lindon Romp MD HF:[id number]     STUDIES: No results found.  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  1. 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  No evidence of disease. Patient completed her chemotherapy in August 2013. Her most recent mammogram on March 02, 2016 was reported as BI-RADS 2, repeat in January 2019. Patient did not require an aromatase inhibitor given the ER/PR status of her tumor. No intervention is needed at this time. Return to clinic in 1 year for further evaluation.   Approximately 20 minutes was spent in discussion of which greater than 50% was consultation.  Patient expressed understanding and was in agreement with this plan. She also understands that She can call clinic at any time with any questions, concerns, or complaints.    Lloyd Huger, MD   09/13/2016 12:18 AM

## 2016-09-16 NOTE — Addendum Note (Signed)
Addended by: Lloyd Huger on: 09/16/2016 08:28 AM   Modules accepted: Level of Service

## 2016-09-20 ENCOUNTER — Encounter: Payer: Self-pay | Admitting: *Deleted

## 2016-09-21 ENCOUNTER — Ambulatory Visit: Payer: Medicare Other | Admitting: Anesthesiology

## 2016-09-21 ENCOUNTER — Ambulatory Visit
Admission: RE | Admit: 2016-09-21 | Discharge: 2016-09-21 | Disposition: A | Discharge status: Home / Self Care | Payer: Medicare Other | Source: Ambulatory Visit | Attending: Gastroenterology | Admitting: Gastroenterology

## 2016-09-21 ENCOUNTER — Encounter: Payer: Self-pay | Admitting: *Deleted

## 2016-09-21 ENCOUNTER — Encounter
Admission: RE | Disposition: A | Discharge status: Home / Self Care | Payer: Self-pay | Source: Ambulatory Visit | Attending: Gastroenterology

## 2016-09-21 DIAGNOSIS — Z1211 Encounter for screening for malignant neoplasm of colon: Secondary | ICD-10-CM | POA: Insufficient documentation

## 2016-09-21 DIAGNOSIS — Z79899 Other long term (current) drug therapy: Secondary | ICD-10-CM | POA: Insufficient documentation

## 2016-09-21 DIAGNOSIS — Z853 Personal history of malignant neoplasm of breast: Secondary | ICD-10-CM | POA: Diagnosis not present

## 2016-09-21 DIAGNOSIS — E039 Hypothyroidism, unspecified: Secondary | ICD-10-CM | POA: Diagnosis not present

## 2016-09-21 DIAGNOSIS — Z923 Personal history of irradiation: Secondary | ICD-10-CM | POA: Diagnosis not present

## 2016-09-21 DIAGNOSIS — K5730 Diverticulosis of large intestine without perforation or abscess without bleeding: Secondary | ICD-10-CM | POA: Diagnosis not present

## 2016-09-21 DIAGNOSIS — Z9221 Personal history of antineoplastic chemotherapy: Secondary | ICD-10-CM | POA: Insufficient documentation

## 2016-09-21 DIAGNOSIS — E782 Mixed hyperlipidemia: Secondary | ICD-10-CM | POA: Diagnosis not present

## 2016-09-21 HISTORY — PX: COLONOSCOPY WITH PROPOFOL: SHX5780

## 2016-09-21 HISTORY — DX: Mixed hyperlipidemia: E78.2

## 2016-09-21 HISTORY — DX: Hypothyroidism, unspecified: E03.9

## 2016-09-21 SURGERY — COLONOSCOPY WITH PROPOFOL
Anesthesia: General

## 2016-09-21 MED ORDER — PROPOFOL 10 MG/ML IV BOLUS
INTRAVENOUS | Status: DC | PRN
  Administered 2016-09-21: 100 mg via INTRAVENOUS

## 2016-09-21 MED ORDER — MIDAZOLAM HCL 2 MG/2ML IJ SOLN
INTRAMUSCULAR | Status: AC
  Filled 2016-09-21: qty 2

## 2016-09-21 MED ORDER — PROPOFOL 500 MG/50ML IV EMUL
INTRAVENOUS | Status: DC | PRN
  Administered 2016-09-21: 140 ug/kg/min via INTRAVENOUS

## 2016-09-21 MED ORDER — MIDAZOLAM HCL 5 MG/5ML IJ SOLN
INTRAMUSCULAR | Status: DC | PRN
  Administered 2016-09-21: 1 mg via INTRAVENOUS

## 2016-09-21 MED ORDER — LIDOCAINE 2% (20 MG/ML) 5 ML SYRINGE
INTRAMUSCULAR | Status: DC | PRN
  Administered 2016-09-21: 40 mg via INTRAVENOUS

## 2016-09-21 MED ORDER — PROPOFOL 10 MG/ML IV BOLUS
INTRAVENOUS | Status: AC
  Filled 2016-09-21: qty 20

## 2016-09-21 MED ORDER — SODIUM CHLORIDE 0.9 % IV SOLN: INTRAVENOUS | Status: DC

## 2016-09-21 MED ORDER — SODIUM CHLORIDE 0.9 % IV SOLN
INTRAVENOUS | Status: DC
  Administered 2016-09-21 (×2): via INTRAVENOUS

## 2016-09-21 MED ORDER — LIDOCAINE HCL (PF) 2 % IJ SOLN
INTRAMUSCULAR | Status: AC
  Filled 2016-09-21: qty 2

## 2016-09-21 MED ORDER — PROPOFOL 500 MG/50ML IV EMUL
INTRAVENOUS | Status: AC
  Filled 2016-09-21: qty 50

## 2016-09-21 MED ORDER — FENTANYL CITRATE (PF) 100 MCG/2ML IJ SOLN
INTRAMUSCULAR | Status: DC | PRN
  Administered 2016-09-21: 50 ug via INTRAVENOUS

## 2016-09-21 MED ORDER — EPHEDRINE SULFATE 50 MG/ML IJ SOLN
INTRAMUSCULAR | Status: AC
  Filled 2016-09-21: qty 1

## 2016-09-21 MED ORDER — FENTANYL CITRATE (PF) 100 MCG/2ML IJ SOLN
INTRAMUSCULAR | Status: AC
  Filled 2016-09-21: qty 2

## 2016-09-21 MED ORDER — GLYCOPYRROLATE 0.2 MG/ML IJ SOLN
INTRAMUSCULAR | Status: DC | PRN
  Administered 2016-09-21: 0.2 mg via INTRAVENOUS

## 2016-09-21 MED ORDER — PHENYLEPHRINE HCL 10 MG/ML IJ SOLN
INTRAMUSCULAR | Status: AC
  Filled 2016-09-21: qty 1

## 2016-09-21 MED ORDER — EPHEDRINE SULFATE 50 MG/ML IJ SOLN
INTRAMUSCULAR | Status: DC | PRN
  Administered 2016-09-21: 10 mg via INTRAVENOUS

## 2016-09-21 NOTE — Anesthesia Preprocedure Evaluation (Signed)
Anesthesia Evaluation  Patient identified by MRN, date of birth, ID band Patient awake    Reviewed: Allergy & Precautions, H&P , NPO status , Patient's Chart, lab work & pertinent test results, reviewed documented beta blocker date and time   Airway Mallampati: II   Neck ROM: full    Dental  (+) Poor Dentition   Pulmonary neg pulmonary ROS,    Pulmonary exam normal        Cardiovascular negative cardio ROS Normal cardiovascular exam Rhythm:regular Rate:Normal     Neuro/Psych negative neurological ROS  negative psych ROS   GI/Hepatic negative GI ROS, Neg liver ROS,   Endo/Other  negative endocrine ROSHypothyroidism   Renal/GU negative Renal ROS  negative genitourinary   Musculoskeletal   Abdominal   Peds  Hematology negative hematology ROS (+)   Anesthesia Other Findings Past Medical History: 2013: Breast cancer ([place])     Comment:  left breast cancer No date: Cancer of left breast ([place])     Comment:  left 2013 with chemo and radiation therapy 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 No date: Hypothyroidism Past Surgical History: 2013: BREAST EXCISIONAL BIOPSY; Left     Comment:  [id number] with chemo and radiation therapy No date: TUBAL LIGATION BMI    Body Mass Index:  26.55 kg/m     Reproductive/Obstetrics negative OB ROS                             Anesthesia Physical Anesthesia Plan  ASA: III  Anesthesia Plan: General   Post-op Pain Management:    Induction:   PONV Risk Score and Plan:   Airway Management Planned:   Additional Equipment:   Intra-op Plan:   Post-operative Plan:   Informed Consent: I have reviewed the patients History and Physical, chart, labs and discussed the procedure including the risks, benefits and alternatives for the proposed anesthesia with the patient or authorized representative who has indicated his/her understanding and acceptance.    Dental Advisory Given  Plan Discussed with: CRNA  Anesthesia Plan Comments:         Anesthesia Quick Evaluation

## 2016-09-21 NOTE — Anesthesia Post-op Follow-up Note (Signed)
Anesthesia QCDR form completed.        

## 2016-09-21 NOTE — Op Note (Signed)
[facility] Gastroenterology Patient Name: Michelle Johns Procedure Date: 09/21/2016 7:17 AM MRN: [id number] Account #: [id number] Date of Birth: 06-13-1945 Admit Type: Outpatient Age: 71 Room: [facility] ENDO ROOM 1 Gender: Female Note Status: Finalized Procedure:            Colonoscopy Indications:          Screening for colorectal malignant neoplasm Providers:            Lollie Sails, MD Referring MD:         Rusty Aus, MD (Referring MD) Medicines:            Monitored Anesthesia Care Complications:        No immediate complications. Procedure:            Pre-Anesthesia Assessment:                       - ASA Grade Assessment: III - A patient with severe                        systemic disease.                       After obtaining informed consent, the colonoscope was                        passed under direct vision. Throughout the procedure,                        the patient's blood pressure, pulse, and oxygen                        saturations were monitored continuously. The                        Colonoscope was introduced through the anus and                        advanced to the the cecum, identified by appendiceal                        orifice and ileocecal valve. The colonoscopy was                        unusually difficult due to multiple diverticula in the                        colon, poor bowel prep, significant looping and a                        tortuous colon. Successful completion of the procedure                        was aided by changing the patient to a supine position,                        changing the patient to a prone position, using manual                        pressure and lavage. The quality of the bowel  preparation was good. Findings:      Multiple small and large-mouthed diverticula were found in the sigmoid       colon, descending colon, transverse colon and ascending colon.      The digital  rectal exam was normal.      The retroflexed view of the distal rectum and anal verge was normal and       showed no anal or rectal abnormalities. Impression:           - Diverticulosis in the sigmoid colon, in the                        descending colon, in the transverse colon and in the                        ascending colon.                       - The distal rectum and anal verge are normal on                        retroflexion view.                       - No specimens collected. Recommendation:       - Repeat colonoscopy in 10 years for screening purposes. Procedure Code(s):    --- Professional ---                       [phone], Colonoscopy, flexible; diagnostic, including                        collection of specimen(s) by brushing or washing, when                        performed (separate procedure) Diagnosis Code(s):    --- Professional ---                       Z12.11, Encounter for screening for malignant neoplasm                        of colon                       K57.30, Diverticulosis of large intestine without                        perforation or abscess without bleeding CPT copyright 2016 American Medical Association. All rights reserved. The codes documented in this report are preliminary and upon coder review may  be revised to meet current compliance requirements. Lollie Sails, MD 09/21/2016 8:19:18 AM This report has been signed electronically. Number of Addenda: 0 Note Initiated On: 09/21/2016 7:17 AM Scope Withdrawal Time: 0 hours 9 minutes 31 seconds  Total Procedure Duration: 0 hours 28 minutes 44 seconds       [facility]

## 2016-09-21 NOTE — H&P (Signed)
Outpatient short stay form Pre-procedure 09/21/2016 7:34 AM Lollie Sails MD  Primary Physician: Dr. Emily Filbert  Reason for visit:  Colonoscopy  History of present illness:  Patient is a 71 year old female presenting today as above. Her last colonoscopy was in 2007. That showed some diverticulosis. She tolerated her prep well. She takes no aspirin or blood thinning agents.    Current Facility-Administered Medications:  .  0.9 %  sodium chloride infusion, , Intravenous, Continuous, Lollie Sails, MD, Last Rate: 20 mL/hr at 09/21/16 0721 .  0.9 %  sodium chloride infusion, , Intravenous, Continuous, Lollie Sails, MD  Prescriptions Prior to Admission  Medication Sig Dispense Refill Last Dose  . CRESTOR 10 MG tablet    09/20/2016 at Unknown time  . levothyroxine (SYNTHROID, LEVOTHROID) 100 MCG tablet Take by mouth.   09/20/2016 at Unknown time  . Cholecalciferol (VITAMIN D-1000 MAX ST) 1000 UNITS tablet Take by mouth.   09/18/2016     No Known Allergies   Past Medical History:  Diagnosis Date  . Breast cancer ([place]) 2013   left breast cancer  . Cancer of left breast ([place])    left 2013 with chemo and radiation therapy  . Combined hyperlipidemia   . Hypothyroidism     Review of systems:      Physical Exam    Heart and lungs: Regular rate and rhythm without rub or gallop, lungs are bilaterally clear.    HEENT: Normocephalic atraumatic eyes are anicteric    Other:     Pertinant exam for procedure: Soft nontender nondistended bowel sounds positive normoactive.    Planned proceedures: Colonoscopy and indicated procedures. I have discussed the risks benefits and complications of procedures to include not limited to bleeding, infection, perforation and the risk of sedation and the patient wishes to proceed.    Lollie Sails, MD Gastroenterology 09/21/2016  7:34 AM

## 2016-09-21 NOTE — Transfer of Care (Signed)
Immediate Anesthesia Transfer of Care Note  Patient: Michelle Johns  Procedure(s) Performed: Procedure(s): COLONOSCOPY WITH PROPOFOL (N/A)  Patient Location: PACU and Endoscopy Unit  Anesthesia Type:General  Level of Consciousness: sedated  Airway & Oxygen Therapy: Patient Spontanous Breathing and Patient connected to nasal cannula oxygen  Post-op Assessment: Report given to RN and Post -op Vital signs reviewed and stable  Post vital signs: Reviewed and stable  Last Vitals:  Vitals:   09/21/16 0659 09/21/16 0820  BP: 100/65   Pulse: 63   Resp: 18   Temp: (!) 36 C (!) 35.6 C  SpO2: 100%     Last Pain:  Vitals:   09/21/16 0820  TempSrc: Tympanic         Complications: No apparent anesthesia complications

## 2016-09-28 NOTE — Anesthesia Postprocedure Evaluation (Signed)
Anesthesia Post Note  Patient: Michelle Johns  Procedure(s) Performed: Procedure(s) (LRB): COLONOSCOPY WITH PROPOFOL (N/A)  Patient location during evaluation: PACU Anesthesia Type: General Level of consciousness: awake and alert Pain management: pain level controlled Vital Signs Assessment: post-procedure vital signs reviewed and stable Respiratory status: spontaneous breathing, nonlabored ventilation, respiratory function stable and patient connected to nasal cannula oxygen Cardiovascular status: blood pressure returned to baseline and stable Postop Assessment: no signs of nausea or vomiting Anesthetic complications: no     Last Vitals:  Vitals:   09/21/16 0840 09/21/16 0850  BP: 100/62 107/68  Pulse:    Resp:    Temp:    SpO2:  96%    Last Pain:  Vitals:   09/21/16 0820  TempSrc: Tympanic                 Molli Barrows

## 2017-03-08 ENCOUNTER — Ambulatory Visit
Admission: RE | Admit: 2017-03-08 | Discharge: 2017-03-08 | Disposition: A | Discharge status: Home / Self Care | Payer: Medicare Other | Source: Ambulatory Visit | Attending: Oncology | Admitting: Oncology

## 2017-03-08 DIAGNOSIS — Z853 Personal history of malignant neoplasm of breast: Secondary | ICD-10-CM | POA: Insufficient documentation

## 2017-03-08 DIAGNOSIS — C50912 Malignant neoplasm of unspecified site of left female breast: Secondary | ICD-10-CM

## 2017-03-08 DIAGNOSIS — Z171 Estrogen receptor negative status [ER-]: Secondary | ICD-10-CM

## 2017-04-05 DIAGNOSIS — Z0000 Encounter for general adult medical examination without abnormal findings: Secondary | ICD-10-CM | POA: Insufficient documentation

## 2017-09-10 NOTE — Progress Notes (Signed)
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  INTERVAL HISTORY: Patient returns to clinic today for routine yearly follow-up.  She continues to feel well and remains asymptomatic.  She has no neurologic complaints. She denies any recent fevers or illnesses. She has a good appetite and denies weight loss. She has no chest pain or shortness of breath. She denies any nausea, vomiting, constipation, or diarrhea. She has no urinary complaints.  Patient feels at her baseline offers no specific complaints today.  REVIEW OF SYSTEMS:   Review of Systems  Constitutional: Negative.  Negative for fever, malaise/fatigue and weight loss.  Respiratory: Negative.  Negative for cough and shortness of breath.   Cardiovascular: Negative.  Negative for chest pain and leg swelling.  Gastrointestinal: Negative.  Negative for abdominal pain.  Genitourinary: Negative.  Negative for dysuria.  Musculoskeletal: Negative.  Negative for back pain.  Skin: Negative.  Negative for rash.  Neurological: Negative.  Negative for sensory change, focal weakness and weakness.  Psychiatric/Behavioral: Negative.  The patient is not nervous/anxious.     As per HPI. Otherwise, a complete review of systems is negative.  ONCOLOGY HISTORY: Oncology History   Chief Complaint/Diagnosis:   72 year old female status post wide local excision and sentinel node biopsy and adjuvant chemotherapy for he pathologic stage I (T1 B. N0 (S. 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  Patient has finished  4 cycles  (one cycle with TCH, 3 cycles with Cytoxan and Adriamycin) patient had severe hypersensitivie pneumonitis was considered secondary to Herceptin sore Herceptin was discontinued.  Patient finished chemotherapy  august of 2013 4.finished radiation to the breast   Summary of Treatment   site treated left breast To Ms. 5040 cGy in 28 fractions Start date September 28, 2011 Complete date November 07, 2011 Using 6 MV photons three-dimensional treatment planning     Cancer of left breast [facility])    PAST MEDICAL HISTORY: Past Medical History:  Diagnosis Date  . Breast cancer ([place]) 2013   left breast cancer  . Cancer of left breast ([place])    left 2013 with chemo and radiation therapy  . Combined hyperlipidemia   . Hypothyroidism     PAST SURGICAL HISTORY: Past Surgical History:  Procedure Laterality Date  . BREAST EXCISIONAL BIOPSY Left 2013   positive,2013 with chemo and radiation therapy  . COLONOSCOPY WITH PROPOFOL N/A 09/21/2016   Procedure: COLONOSCOPY WITH PROPOFOL;  Surgeon: Lollie Sails, MD;  Location: [facility] ENDOSCOPY;  Service: Endoscopy;  Laterality: N/A;  . TUBAL LIGATION      FAMILY HISTORY Family History  Problem Relation Age of Onset  . Breast cancer Sister 12    GYNECOLOGIC HISTORY:  No LMP recorded. Patient is postmenopausal.     ADVANCED DIRECTIVES:    HEALTH MAINTENANCE: Social History   Tobacco Use  . Smoking status: Never Smoker  . Smokeless tobacco: Never Used  Substance Use Topics  . Alcohol use: No    Alcohol/week: 0.0 standard drinks  . Drug use: No    Allergies  Allergen Reactions  . Other Other (See Comments)    Pt took herceptin,  taxotere, and carboplatin 06/2011 and was in hospital for several days with fever 104. Dr. Oliva Bustard at the time did not know what chemo she had reaction to but was started on another treatment thereafter.    Current Outpatient Medications  Medication Sig Dispense Refill  . Cholecalciferol (VITAMIN D-1000 MAX ST)  1000 UNITS tablet Take 1,000 Units by mouth daily.     . CRESTOR 10 MG tablet Take 10 mg by mouth daily.     Marland Kitchen levothyroxine (SYNTHROID, LEVOTHROID) 100 MCG tablet Take 100 mcg by mouth daily before breakfast.      No current facility-administered medications for this visit.     OBJECTIVE: BP 108/63   Pulse 64   Temp 98.2 F (36.8 C) (Oral)   Resp 18   Ht 5' 5.5" (1.664 m)   Wt 167 lb 12.8 oz (76.1 kg)   BMI 27.50 kg/m    Body mass index is 27.5 kg/m.    ECOG FS:0 - Asymptomatic  General: Well-developed, well-nourished, no acute distress. Eyes: Pink conjunctiva, anicteric sclera. HEENT: Normocephalic, moist mucous membranes. Breast: Patient declined breast exam today. Lungs: Clear to auscultation bilaterally. Heart: Regular rate and rhythm. No rubs, murmurs, or gallops. Abdomen: Soft, nontender, nondistended. No organomegaly noted, normoactive bowel sounds. Musculoskeletal: No edema, cyanosis, or clubbing. Neuro: Alert, answering all questions appropriately. Cranial nerves grossly intact. Skin: No rashes or petechiae noted. Psych: Normal affect.  LAB RESULTS:  No visits with results within 3 Day(s) from this visit.  Latest known visit with results is:  Appointment on 08/30/2016  Component Date Value Ref Range Status  . CA 27.29 08/30/2016 6.8  0.0 - 38.6 U/mL Final   Comment: (NOTE) [employer]/ACS methodology Performed At: [facility] [place], [place] [id number] Lindon Romp MD BO:[id number]     STUDIES: No results found.  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  1. 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  No evidence of disease. Patient completed her chemotherapy in August 2013.  Patient's most recent mammogram on March 08, 2017  was reported as BI-RADS 2.  Repeat in January 2020.  After lengthy discussion with the patient, no further follow-up has been scheduled.  She agreed to continue to have her primary care physician perform breast exams and ensure her mammograms are up-to-date.  Please refer patient back if there are any questions or concerns.    I spent a total of 20 minutes face-to-face with the patient of which greater than 50% of the visit was spent in counseling and coordination of care as detailed above.  Patient expressed understanding and was in agreement with this plan. She also understands that She can call clinic at any time with any questions, concerns, or complaints.    Lloyd Huger, MD   09/16/2017 7:31 AM

## 2017-09-12 ENCOUNTER — Encounter: Payer: Self-pay | Admitting: Oncology

## 2017-09-12 ENCOUNTER — Inpatient Hospital Stay: Payer: Medicare Other | Attending: Oncology | Admitting: Oncology

## 2017-09-12 VITALS — BP 108/63 | HR 64 | Temp 98.2°F | Resp 18 | Ht 65.5 in | Wt 167.8 lb

## 2017-09-12 DIAGNOSIS — Z923 Personal history of irradiation: Secondary | ICD-10-CM

## 2017-09-12 DIAGNOSIS — Z803 Family history of malignant neoplasm of breast: Secondary | ICD-10-CM | POA: Diagnosis not present

## 2017-09-12 DIAGNOSIS — E785 Hyperlipidemia, unspecified: Secondary | ICD-10-CM | POA: Diagnosis not present

## 2017-09-12 DIAGNOSIS — Z79899 Other long term (current) drug therapy: Secondary | ICD-10-CM | POA: Insufficient documentation

## 2017-09-12 DIAGNOSIS — Z171 Estrogen receptor negative status [ER-]: Secondary | ICD-10-CM

## 2017-09-12 DIAGNOSIS — Z853 Personal history of malignant neoplasm of breast: Secondary | ICD-10-CM | POA: Insufficient documentation

## 2017-09-12 DIAGNOSIS — Z9221 Personal history of antineoplastic chemotherapy: Secondary | ICD-10-CM | POA: Diagnosis not present

## 2017-09-12 DIAGNOSIS — E039 Hypothyroidism, unspecified: Secondary | ICD-10-CM

## 2017-09-12 DIAGNOSIS — C50912 Malignant neoplasm of unspecified site of left female breast: Secondary | ICD-10-CM

## 2018-02-14 ENCOUNTER — Other Ambulatory Visit: Payer: Self-pay | Admitting: Internal Medicine

## 2018-02-14 DIAGNOSIS — Z1231 Encounter for screening mammogram for malignant neoplasm of breast: Secondary | ICD-10-CM

## 2018-03-13 ENCOUNTER — Ambulatory Visit
Admission: RE | Admit: 2018-03-13 | Discharge: 2018-03-13 | Disposition: A | Discharge status: Home / Self Care | Payer: Medicare Other | Source: Ambulatory Visit | Attending: Internal Medicine | Admitting: Internal Medicine

## 2018-03-13 DIAGNOSIS — Z1231 Encounter for screening mammogram for malignant neoplasm of breast: Secondary | ICD-10-CM | POA: Diagnosis not present

## 2018-03-13 HISTORY — DX: Personal history of irradiation: Z92.3

## 2019-02-17 ENCOUNTER — Other Ambulatory Visit: Payer: Self-pay | Admitting: Internal Medicine

## 2019-02-17 DIAGNOSIS — Z1231 Encounter for screening mammogram for malignant neoplasm of breast: Secondary | ICD-10-CM

## 2019-02-26 ENCOUNTER — Ambulatory Visit: Payer: Medicare PPO | Attending: Internal Medicine

## 2019-02-26 DIAGNOSIS — Z20822 Contact with and (suspected) exposure to covid-19: Secondary | ICD-10-CM

## 2019-02-27 LAB — NOVEL CORONAVIRUS, NAA: SARS-CoV-2, NAA: NOT DETECTED

## 2019-06-02 ENCOUNTER — Ambulatory Visit
Admission: RE | Admit: 2019-06-02 | Discharge: 2019-06-02 | Disposition: A | Discharge status: Home / Self Care | Payer: Medicare PPO | Source: Ambulatory Visit | Attending: Internal Medicine | Admitting: Internal Medicine

## 2019-06-02 DIAGNOSIS — Z1231 Encounter for screening mammogram for malignant neoplasm of breast: Secondary | ICD-10-CM | POA: Diagnosis not present

## 2019-10-01 ENCOUNTER — Other Ambulatory Visit: Payer: Medicare PPO

## 2019-10-01 ENCOUNTER — Other Ambulatory Visit: Payer: Self-pay | Admitting: Radiology

## 2019-10-01 DIAGNOSIS — Z20822 Contact with and (suspected) exposure to covid-19: Secondary | ICD-10-CM

## 2019-10-03 LAB — SARS-COV-2, NAA 2 DAY TAT

## 2019-10-03 LAB — NOVEL CORONAVIRUS, NAA: SARS-CoV-2, NAA: NOT DETECTED

## 2019-12-01 ENCOUNTER — Ambulatory Visit: Payer: Medicare PPO | Attending: Internal Medicine

## 2019-12-01 DIAGNOSIS — Z23 Encounter for immunization: Secondary | ICD-10-CM

## 2019-12-01 NOTE — Progress Notes (Signed)
   Covid-19 Vaccination Clinic  Name:  TERRINA DOCTER    MRN: [id number] DOB: September 30, 1945  12/01/2019  Ms. Devine was observed post Covid-19 immunization for 15 minutes without incident. She was provided with Vaccine Information Sheet and instruction to access the V-Safe system.   Ms. Aispuro was instructed to call 911 with any severe reactions post vaccine: Marland Kitchen Difficulty breathing  . Swelling of face and throat  . A fast heartbeat  . A bad rash all over body  . Dizziness and weakness

## 2020-04-07 ENCOUNTER — Other Ambulatory Visit: Payer: Self-pay | Admitting: Internal Medicine

## 2020-04-07 DIAGNOSIS — Z1231 Encounter for screening mammogram for malignant neoplasm of breast: Secondary | ICD-10-CM

## 2020-06-03 ENCOUNTER — Other Ambulatory Visit: Payer: Self-pay

## 2020-06-03 ENCOUNTER — Ambulatory Visit
Admission: RE | Admit: 2020-06-03 | Discharge: 2020-06-03 | Disposition: A | Discharge status: Home / Self Care | Payer: Medicare PPO | Source: Ambulatory Visit | Attending: Internal Medicine | Admitting: Internal Medicine

## 2020-06-03 DIAGNOSIS — Z1231 Encounter for screening mammogram for malignant neoplasm of breast: Secondary | ICD-10-CM | POA: Diagnosis present

## 2020-06-07 ENCOUNTER — Other Ambulatory Visit: Payer: Self-pay | Admitting: Internal Medicine

## 2020-06-07 DIAGNOSIS — R928 Other abnormal and inconclusive findings on diagnostic imaging of breast: Secondary | ICD-10-CM

## 2020-06-07 DIAGNOSIS — N6489 Other specified disorders of breast: Secondary | ICD-10-CM

## 2020-06-09 DIAGNOSIS — M180 Bilateral primary osteoarthritis of first carpometacarpal joints: Secondary | ICD-10-CM | POA: Insufficient documentation

## 2020-06-09 DIAGNOSIS — M7750 Other enthesopathy of unspecified foot: Secondary | ICD-10-CM | POA: Insufficient documentation

## 2020-06-10 ENCOUNTER — Ambulatory Visit
Admission: RE | Admit: 2020-06-10 | Discharge: 2020-06-10 | Disposition: A | Discharge status: Home / Self Care | Payer: Medicare PPO | Source: Ambulatory Visit | Attending: Internal Medicine | Admitting: Internal Medicine

## 2020-06-10 ENCOUNTER — Other Ambulatory Visit: Payer: Self-pay

## 2020-06-10 DIAGNOSIS — N6489 Other specified disorders of breast: Secondary | ICD-10-CM | POA: Insufficient documentation

## 2020-06-10 DIAGNOSIS — R928 Other abnormal and inconclusive findings on diagnostic imaging of breast: Secondary | ICD-10-CM | POA: Insufficient documentation

## 2021-03-07 ENCOUNTER — Other Ambulatory Visit: Payer: Self-pay | Admitting: Internal Medicine

## 2021-03-07 DIAGNOSIS — Z1231 Encounter for screening mammogram for malignant neoplasm of breast: Secondary | ICD-10-CM

## 2021-06-07 ENCOUNTER — Ambulatory Visit
Admission: RE | Admit: 2021-06-07 | Discharge: 2021-06-07 | Disposition: A | Discharge status: Home / Self Care | Payer: Medicare PPO | Source: Ambulatory Visit | Attending: Internal Medicine | Admitting: Internal Medicine

## 2021-06-07 DIAGNOSIS — Z1231 Encounter for screening mammogram for malignant neoplasm of breast: Secondary | ICD-10-CM | POA: Diagnosis not present

## 2021-06-10 ENCOUNTER — Other Ambulatory Visit: Payer: Self-pay | Admitting: Internal Medicine

## 2021-06-10 DIAGNOSIS — N6489 Other specified disorders of breast: Secondary | ICD-10-CM

## 2021-06-10 DIAGNOSIS — R928 Other abnormal and inconclusive findings on diagnostic imaging of breast: Secondary | ICD-10-CM

## 2021-06-27 ENCOUNTER — Ambulatory Visit
Admission: RE | Admit: 2021-06-27 | Discharge: 2021-06-27 | Disposition: A | Discharge status: Home / Self Care | Payer: Medicare PPO | Source: Ambulatory Visit | Attending: Internal Medicine | Admitting: Internal Medicine

## 2021-06-27 DIAGNOSIS — R928 Other abnormal and inconclusive findings on diagnostic imaging of breast: Secondary | ICD-10-CM

## 2021-06-27 DIAGNOSIS — N6489 Other specified disorders of breast: Secondary | ICD-10-CM | POA: Insufficient documentation

## 2022-01-05 ENCOUNTER — Other Ambulatory Visit: Payer: Self-pay | Admitting: Internal Medicine

## 2022-01-05 ENCOUNTER — Ambulatory Visit
Admission: RE | Admit: 2022-01-05 | Discharge: 2022-01-05 | Disposition: A | Discharge status: Home / Self Care | Payer: Medicare PPO | Source: Ambulatory Visit | Attending: Internal Medicine | Admitting: Internal Medicine

## 2022-01-05 DIAGNOSIS — R1031 Right lower quadrant pain: Secondary | ICD-10-CM

## 2022-01-05 DIAGNOSIS — R1032 Left lower quadrant pain: Secondary | ICD-10-CM | POA: Insufficient documentation

## 2022-01-05 DIAGNOSIS — R634 Abnormal weight loss: Secondary | ICD-10-CM

## 2022-01-05 DIAGNOSIS — K921 Melena: Secondary | ICD-10-CM | POA: Diagnosis present

## 2022-01-05 MED ORDER — IOHEXOL 300 MG/ML  SOLN
80.0000 mL | Freq: Once | INTRAMUSCULAR | Status: AC | PRN
  Administered 2022-01-05: 80 mL via INTRAVENOUS

## 2022-01-19 ENCOUNTER — Encounter: Payer: Self-pay | Admitting: Gastroenterology

## 2022-01-20 ENCOUNTER — Encounter
Admission: RE | Disposition: A | Discharge status: Home / Self Care | Payer: Self-pay | Source: Home / Self Care | Attending: Gastroenterology

## 2022-01-20 ENCOUNTER — Other Ambulatory Visit: Payer: Self-pay

## 2022-01-20 ENCOUNTER — Encounter: Payer: Self-pay | Admitting: Gastroenterology

## 2022-01-20 ENCOUNTER — Ambulatory Visit: Payer: Medicare PPO | Admitting: Anesthesiology

## 2022-01-20 ENCOUNTER — Ambulatory Visit
Admission: RE | Admit: 2022-01-20 | Discharge: 2022-01-20 | Disposition: A | Discharge status: Home / Self Care | Payer: Medicare PPO | Attending: Gastroenterology | Admitting: Gastroenterology

## 2022-01-20 DIAGNOSIS — Z6821 Body mass index (BMI) 21.0-21.9, adult: Secondary | ICD-10-CM | POA: Diagnosis not present

## 2022-01-20 DIAGNOSIS — K644 Residual hemorrhoidal skin tags: Secondary | ICD-10-CM | POA: Insufficient documentation

## 2022-01-20 DIAGNOSIS — K529 Noninfective gastroenteritis and colitis, unspecified: Secondary | ICD-10-CM | POA: Diagnosis not present

## 2022-01-20 DIAGNOSIS — R634 Abnormal weight loss: Secondary | ICD-10-CM | POA: Diagnosis not present

## 2022-01-20 DIAGNOSIS — Z5309 Procedure and treatment not carried out because of other contraindication: Secondary | ICD-10-CM | POA: Diagnosis not present

## 2022-01-20 DIAGNOSIS — K5730 Diverticulosis of large intestine without perforation or abscess without bleeding: Secondary | ICD-10-CM | POA: Diagnosis not present

## 2022-01-20 DIAGNOSIS — D509 Iron deficiency anemia, unspecified: Secondary | ICD-10-CM | POA: Diagnosis present

## 2022-01-20 DIAGNOSIS — K921 Melena: Secondary | ICD-10-CM | POA: Diagnosis not present

## 2022-01-20 DIAGNOSIS — E039 Hypothyroidism, unspecified: Secondary | ICD-10-CM | POA: Diagnosis not present

## 2022-01-20 HISTORY — PX: COLONOSCOPY: SHX5424

## 2022-01-20 HISTORY — PX: ESOPHAGOGASTRODUODENOSCOPY: SHX5428

## 2022-01-20 SURGERY — COLONOSCOPY
Anesthesia: General

## 2022-01-20 MED ORDER — PROPOFOL 10 MG/ML IV BOLUS
INTRAVENOUS | Status: DC | PRN
Start: 2022-01-20 — End: 2022-01-20
  Administered 2022-01-20: 80 mg via INTRAVENOUS
  Administered 2022-01-20: 140 ug/kg/min via INTRAVENOUS

## 2022-01-20 MED ORDER — PHENYLEPHRINE 80 MCG/ML (10ML) SYRINGE FOR IV PUSH (FOR BLOOD PRESSURE SUPPORT)
PREFILLED_SYRINGE | INTRAVENOUS | Status: AC
  Filled 2022-01-20: qty 10

## 2022-01-20 MED ORDER — SODIUM CHLORIDE 0.9 % IV SOLN: INTRAVENOUS | Status: DC

## 2022-01-20 MED ORDER — LIDOCAINE HCL (PF) 2 % IJ SOLN
INTRAMUSCULAR | Status: AC
  Filled 2022-01-20: qty 5

## 2022-01-20 MED ORDER — LIDOCAINE HCL (CARDIAC) PF 100 MG/5ML IV SOSY
PREFILLED_SYRINGE | INTRAVENOUS | Status: DC | PRN
  Administered 2022-01-20: 100 mg via INTRAVENOUS

## 2022-01-20 MED ORDER — PROPOFOL 1000 MG/100ML IV EMUL
INTRAVENOUS | Status: AC
  Filled 2022-01-20: qty 100

## 2022-01-20 MED ORDER — PHENYLEPHRINE HCL (PRESSORS) 10 MG/ML IV SOLN
INTRAVENOUS | Status: DC | PRN
  Administered 2022-01-20: 80 ug via INTRAVENOUS

## 2022-01-20 NOTE — H&P (Signed)
Pre-Procedure H&P   Patient ID: Michelle Johns is a 76 y.o. female.  Gastroenterology Provider: Annamaria Helling, DO  Referring Provider: Laurine Blazer, PA PCP: Rusty Aus, MD  Date: 01/20/2022  HPI Michelle Johns is a 76 y.o. female who presents today for Esophagogastroduodenoscopy and Colonoscopy for Chronic diarrhea, weight loss, anemia .  Starting in November the patient has had loose stools multiple times a day with nocturnal awakenings.  She notes blood with his bowel movements as well- mostly on tissue paper.  She is currently had 10 pounds of unintentional weight loss.  Also notes night sweats (these have improved).   No dysphagia/odynophagia, melena.  CT performed demonstrating sigmoid diverticulosis with some thickening in the sigmoid area.  Also noted adenopathy and increased stool burden.  Infectious stool studies have been negative.  Fecal calprotectin 2290 CRP 57 sed rate 65 ferritin 341 iron sat 14 TIBC 226 hemoglobin 10.2 MCV 94 platelets 221,000  2013 history of breast cancer.  Underwent colonoscopy in August 2018 which was negative.  Also had a negative colonoscopy in 2007.   Past Medical History:  Diagnosis Date   Breast cancer ([place]) 2013   left breast cancer   Cancer of left breast ([place])    left 2013 with chemo and radiation therapy   Combined hyperlipidemia    Hypothyroidism    Personal history of chemotherapy 2013   left breast ca   Personal history of radiation therapy 2013   LEFT lumpectomy w/ radiation    Past Surgical History:  Procedure Laterality Date   BREAST EXCISIONAL BIOPSY Left 2013   positive,2013 with chemo and radiation therapy   BREAST LUMPECTOMY Left 2013   w/ radiation and chemo   COLONOSCOPY WITH PROPOFOL N/A 09/21/2016   Procedure: COLONOSCOPY WITH PROPOFOL;  Surgeon: Lollie Sails, MD;  Location: [facility] ENDOSCOPY;  Service: Endoscopy;  Laterality: N/A;   TUBAL LIGATION      Family History No h/o GI  disease or malignancy  Review of Systems  Constitutional:  Negative for activity change, appetite change, chills, diaphoresis, fatigue, fever and unexpected weight change.  HENT:  Negative for trouble swallowing and voice change.   Respiratory:  Negative for shortness of breath and wheezing.   Cardiovascular:  Negative for chest pain, palpitations and leg swelling.  Gastrointestinal:  Positive for blood in stool and diarrhea. Negative for abdominal distention, abdominal pain, anal bleeding, constipation, nausea, rectal pain and vomiting.  Musculoskeletal:  Negative for arthralgias and myalgias.  Skin:  Negative for color change and pallor.  Neurological:  Negative for dizziness, syncope and weakness.  Psychiatric/Behavioral:  Negative for confusion.   All other systems reviewed and are negative.    Medications No current facility-administered medications on file prior to encounter.   Current Outpatient Medications on File Prior to Encounter  Medication Sig Dispense Refill   Cholecalciferol (VITAMIN D-1000 MAX ST) 1000 UNITS tablet Take 1,000 Units by mouth daily.      CRESTOR 10 MG tablet Take 10 mg by mouth daily.      levothyroxine (SYNTHROID, LEVOTHROID) 100 MCG tablet Take 100 mcg by mouth daily before breakfast.       Pertinent medications related to GI and procedure were reviewed by me with the patient prior to the procedure   Current Facility-Administered Medications:    0.9 %  sodium chloride infusion, , Intravenous, Continuous, Annamaria Helling, DO, Last Rate: 20 mL/hr at 01/20/22 0851, New Bag at 01/20/22 [phone]  Allergies  Allergen Reactions   Other Other (See Comments)    Pt took herceptin, taxotere, and carboplatin 06/2011 and was in hospital for several days with fever 104. Dr. Oliva Bustard at the time did not know what chemo she had reaction to but was started on another treatment thereafter.   Allergies were reviewed by me prior to the procedure  Objective    Body mass index is 21.63 kg/m. Vitals:   01/20/22 0839  BP: 114/63  Pulse: 94  Resp: 18  Temp: (!) 97.3 F (36.3 C)  TempSrc: Temporal  SpO2: 99%  Weight: 59 kg  Height: '5\' 5"'$  (1.651 m)     Physical Exam Vitals and nursing note reviewed.  Constitutional:      General: She is not in acute distress.    Appearance: Normal appearance. She is not ill-appearing, toxic-appearing or diaphoretic.  HENT:     Head: Normocephalic and atraumatic.     Nose: Nose normal.     Mouth/Throat:     Mouth: Mucous membranes are moist.     Pharynx: Oropharynx is clear.  Eyes:     General: No scleral icterus.    Extraocular Movements: Extraocular movements intact.  Cardiovascular:     Rate and Rhythm: Normal rate and regular rhythm.     Heart sounds: Murmur heard.     No friction rub. No gallop.  Pulmonary:     Effort: Pulmonary effort is normal. No respiratory distress.     Breath sounds: Normal breath sounds. No wheezing, rhonchi or rales.  Abdominal:     General: Abdomen is flat. Bowel sounds are normal. There is no distension.     Palpations: Abdomen is soft.     Tenderness: There is no abdominal tenderness. There is no guarding or rebound.  Musculoskeletal:     Cervical back: Neck supple.     Right lower leg: No edema.     Left lower leg: No edema.  Skin:    General: Skin is warm and dry.     Coloration: Skin is not jaundiced or pale.  Neurological:     General: No focal deficit present.     Mental Status: She is alert and oriented to person, place, and time. Mental status is at baseline.  Psychiatric:        Mood and Affect: Mood normal.        Behavior: Behavior normal.        Thought Content: Thought content normal.        Judgment: Judgment normal.      Assessment:  Michelle Johns is a 76 y.o. female  who presents today for Esophagogastroduodenoscopy and Colonoscopy for Chronic diarrhea, weight loss, anemia.  Plan:  Esophagogastroduodenoscopy and Colonoscopy  with possible intervention today  Esophagogastroduodenoscopy and Colonoscopy with possible biopsy, control of bleeding, polypectomy, and interventions as necessary has been discussed with the patient/patient representative. Informed consent was obtained from the patient/patient representative after explaining the indication, nature, and risks of the procedure including but not limited to death, bleeding, perforation, missed neoplasm/lesions, cardiorespiratory compromise, and reaction to medications. Opportunity for questions was given and appropriate answers were provided. Patient/patient representative has verbalized understanding is amenable to undergoing the procedure.   Annamaria Helling, DO  [facility] Gastroenterology  Portions of the record may have been created with voice recognition software. Occasional wrong-word or 'sound-a-like' substitutions may have occurred due to the inherent limitations of voice recognition software.  Read the chart carefully and recognize, using  context, where substitutions may have occurred.

## 2022-01-20 NOTE — Transfer of Care (Signed)
Immediate Anesthesia Transfer of Care Note  Patient: THEORA VANKIRK  Procedure(s) Performed: COLONOSCOPY ESOPHAGOGASTRODUODENOSCOPY (EGD)  Patient Location: ENDO UNIT  Anesthesia Type:General  Level of Consciousness: drowsy  Airway & Oxygen Therapy: Patient Spontanous Breathing  Post-op Assessment: Report given to RN and Post -op Vital signs reviewed and stable  Post vital signs: Reviewed and stable  Last Vitals:  Vitals Value Taken Time  BP 107/52 01/20/22 0943  Temp 36.1 C 01/20/22 0941  Pulse 74 01/20/22 0942  Resp 22 01/20/22 0942  SpO2 99 % 01/20/22 0942  Vitals shown include unvalidated device data.  Last Pain:  Vitals:   01/20/22 0941  TempSrc: Temporal  PainSc: Asleep         Complications: No notable events documented.

## 2022-01-20 NOTE — Op Note (Signed)
[facility] Gastroenterology Patient Name: Michelle Johns Procedure Date: 01/20/2022 9:01 AM MRN: [id number] Account #: [id number] Date of Birth: 06/07/1945 Admit Type: Outpatient Age: 76 Room: [facility] ENDO ROOM 1 Gender: Female Note Status: Finalized Instrument Name: Peds Colonoscope [id number] Procedure:             Colonoscopy Indications:           Iron deficiency anemia, Weight loss Providers:             Annamaria Helling DO, DO Medicines:             Monitored Anesthesia Care Complications:         No immediate complications. Estimated blood loss: None. Procedure:             Pre-Anesthesia Assessment:                        - Prior to the procedure, a History and Physical was                         performed, and patient medications and allergies were                         reviewed. The patient is competent. The risks and                         benefits of the procedure and the sedation options and                         risks were discussed with the patient. All questions                         were answered and informed consent was obtained.                         Patient identification and proposed procedure were                         verified by the physician, the nurse, the anesthetist                         and the technician in the endoscopy suite. Mental                         Status Examination: alert and oriented. Airway                         Examination: normal oropharyngeal airway and neck                         mobility. Respiratory Examination: clear to                         auscultation. CV Examination: regular rate and rhythm                         and systolic murmur. Prophylactic Antibiotics: The                         patient does  not require prophylactic antibiotics.                         Prior Anticoagulants: The patient has taken no                         anticoagulant or antiplatelet agents. ASA Grade                          Assessment: II - A patient with mild systemic disease.                         After reviewing the risks and benefits, the patient                         was deemed in satisfactory condition to undergo the                         procedure. The anesthesia plan was to use monitored                         anesthesia care (MAC). Immediately prior to                         administration of medications, the patient was                         re-assessed for adequacy to receive sedatives. The                         heart rate, respiratory rate, oxygen saturations,                         blood pressure, adequacy of pulmonary ventilation, and                         response to care were monitored throughout the                         procedure. The physical status of the patient was                         re-assessed after the procedure.                        After obtaining informed consent, the colonoscope was                         passed under direct vision. Throughout the procedure,                         the patient's blood pressure, pulse, and oxygen                         saturations were monitored continuously. The                         Colonoscope was introduced through the anus with the  intention of advancing to the cecum. The scope was                         advanced to the rectum before the procedure was                         aborted. Medications were given. The colonoscopy was                         aborted due to poor bowel prep with stool present. The                         colonoscopy was performed without difficulty. The                         patient tolerated the procedure well. The quality of                         the bowel preparation was inadequate; solid stool                         within rectum. No anatomical landmarks were                         photographed. Findings:      Skin tags were found on perianal exam.       The digital rectal exam was normal. Pertinent negatives include normal       sphincter tone.      A large amount of extensive amounts of solid stool was found in the       rectum, precluding visualization. Procedure aborted. Estimated blood       loss: none. Impression:            - The procedure was aborted due to poor bowel prep                         with stool present.                        - Perianal skin tags found on perianal exam.                        - Stool in the rectum.                        - No specimens collected. Recommendation:        - Patient has a contact number available for                         emergencies. The signs and symptoms of potential                         delayed complications were discussed with the patient.                         Return to normal activities tomorrow. Written                         discharge instructions were  provided to the patient.                        - Discharge patient to home.                        - Resume previous diet.                        - Continue present medications.                        - Repeat colonoscopy in 1 week because the bowel                         preparation was poor.                        - Return to GI office as previously scheduled.                        - The findings and recommendations were discussed with                         the patient. Procedure Code(s):     --- Professional ---                        [phone], 96, Colonoscopy, flexible; diagnostic,                         including collection of specimen(s) by brushing or                         washing, when performed (separate procedure) Diagnosis Code(s):     --- Professional ---                        K64.4, Residual hemorrhoidal skin tags                        D50.9, Iron deficiency anemia, unspecified                        R63.4, Abnormal weight loss CPT copyright 2022 [employer] Association. All rights  reserved. The codes documented in this report are preliminary and upon coder review may  be revised to meet current compliance requirements. Attending Participation:      I personally performed the entire procedure. Volney American, DO Annamaria Helling DO, DO 01/20/2022 9:48:41 AM This report has been signed electronically. Number of Addenda: 0 Note Initiated On: 01/20/2022 9:01 AM Total Procedure Duration: 0 hours 1 minute 32 seconds  Estimated Blood Loss:  Estimated blood loss: none.      [facility]

## 2022-01-20 NOTE — Anesthesia Preprocedure Evaluation (Signed)
Anesthesia Evaluation  Patient identified by MRN, date of birth, ID band Patient awake    Reviewed: Allergy & Precautions, NPO status , Patient's Chart, lab work & pertinent test results  History of Anesthesia Complications Negative for: history of anesthetic complications  Airway Mallampati: III  TM Distance: <3 FB Neck ROM: full    Dental  (+) Chipped   Pulmonary neg pulmonary ROS, neg shortness of breath   Pulmonary exam normal        Cardiovascular Exercise Tolerance: Good (-) angina negative cardio ROS Normal cardiovascular exam     Neuro/Psych negative neurological ROS  negative psych ROS   GI/Hepatic negative GI ROS, Neg liver ROS,neg GERD  ,,  Endo/Other  Hypothyroidism    Renal/GU negative Renal ROS  negative genitourinary   Musculoskeletal   Abdominal   Peds  Hematology negative hematology ROS (+)   Anesthesia Other Findings Past Medical History: 2013: Breast cancer ([place])     Comment:  left breast cancer No date: Cancer of left breast ([place])     Comment:  left 2013 with chemo and radiation therapy 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 No date: Hypothyroidism 2013: Personal history of chemotherapy     Comment:  left breast ca 2013: Personal history of radiation therapy     Comment:  LEFT lumpectomy w/ radiation  Past Surgical History: 2013: BREAST EXCISIONAL BIOPSY; Left     Comment:  positive,2013 with chemo and radiation therapy 2013: BREAST LUMPECTOMY; Left     Comment:  w/ radiation and chemo 09/21/2016: COLONOSCOPY WITH PROPOFOL; N/A     Comment:  Procedure: COLONOSCOPY WITH PROPOFOL;  Surgeon:               Lollie Sails, MD;  Location: [facility] ENDOSCOPY;                Service: Endoscopy;  Laterality: N/A; No date: TUBAL LIGATION  BMI    Body Mass Index: 21.63 kg/m      Reproductive/Obstetrics negative OB ROS                             Anesthesia  Physical Anesthesia Plan  ASA: 2  Anesthesia Plan: General   Post-op Pain Management:    Induction: Intravenous  PONV Risk Score and Plan: Propofol infusion and TIVA  Airway Management Planned: Natural Airway and Nasal Cannula  Additional Equipment:   Intra-op Plan:   Post-operative Plan:   Informed Consent: I have reviewed the patients History and Physical, chart, labs and discussed the procedure including the risks, benefits and alternatives for the proposed anesthesia with the patient or authorized representative who has indicated his/her understanding and acceptance.     Dental Advisory Given  Plan Discussed with: Anesthesiologist, CRNA and Surgeon  Anesthesia Plan Comments: (Patient consented for risks of anesthesia including but not limited to:  - adverse reactions to medications - risk of airway placement if required - damage to eyes, teeth, lips or other oral mucosa - nerve damage due to positioning  - sore throat or hoarseness - Damage to heart, brain, nerves, lungs, other parts of body or loss of life  Patient voiced understanding.)       Anesthesia Quick Evaluation

## 2022-01-20 NOTE — Interval H&P Note (Signed)
History and Physical Interval Note: Preprocedure H&P from 01/20/22  was reviewed and there was no interval change after seeing and examining the patient.  Written consent was obtained from the patient after discussion of risks, benefits, and alternatives. Patient has consented to proceed with Esophagogastroduodenoscopy and Colonoscopy with possible intervention    01/20/2022 9:17 AM  Michelle Johns  has presented today for surgery, with the diagnosis of Chronic anemia, unspecified (D64.9).  The various methods of treatment have been discussed with the patient and family. After consideration of risks, benefits and other options for treatment, the patient has consented to  Procedure(s): COLONOSCOPY (N/A) ESOPHAGOGASTRODUODENOSCOPY (EGD) (N/A) as a surgical intervention.  The patient's history has been reviewed, patient examined, no change in status, stable for surgery.  I have reviewed the patient's chart and labs.  Questions were answered to the patient's satisfaction.     Annamaria Helling

## 2022-01-20 NOTE — Op Note (Signed)
[facility] Gastroenterology Patient Name: Michelle Johns Procedure Date: 01/20/2022 9:01 AM MRN: [id number] Account #: [id number] Date of Birth: 05-Apr-1945 Admit Type: Outpatient Age: 76 Room: [facility] ENDO ROOM 1 Gender: Female Note Status: Finalized Instrument Name: Upper Endoscope [id number] Procedure:             Upper GI endoscopy Indications:           Iron deficiency anemia, Weight loss Providers:             Annamaria Helling DO, DO Medicines:             Monitored Anesthesia Care Complications:         No immediate complications. Estimated blood loss:                         Minimal. Procedure:             Pre-Anesthesia Assessment:                        - Prior to the procedure, a History and Physical was                         performed, and patient medications and allergies were                         reviewed. The patient is competent. The risks and                         benefits of the procedure and the sedation options and                         risks were discussed with the patient. All questions                         were answered and informed consent was obtained.                         Patient identification and proposed procedure were                         verified by the physician, the nurse, the anesthetist                         and the technician in the endoscopy suite. Mental                         Status Examination: alert and oriented. Airway                         Examination: normal oropharyngeal airway and neck                         mobility. Respiratory Examination: clear to                         auscultation. CV Examination: RRR, no murmurs, no S3                         or S4. Prophylactic Antibiotics: The  patient does not                         require prophylactic antibiotics. Prior                         Anticoagulants: The patient has taken no anticoagulant                         or antiplatelet agents. ASA  Grade Assessment: II - A                         patient with mild systemic disease. After reviewing                         the risks and benefits, the patient was deemed in                         satisfactory condition to undergo the procedure. The                         anesthesia plan was to use monitored anesthesia care                         (MAC). Immediately prior to administration of                         medications, the patient was re-assessed for adequacy                         to receive sedatives. The heart rate, respiratory                         rate, oxygen saturations, blood pressure, adequacy of                         pulmonary ventilation, and response to care were                         monitored throughout the procedure. The physical                         status of the patient was re-assessed after the                         procedure.                        After obtaining informed consent, the endoscope was                         passed under direct vision. Throughout the procedure,                         the patient's blood pressure, pulse, and oxygen                         saturations were monitored continuously. The Endoscope  was introduced through the mouth, and advanced to the                         second part of duodenum. The upper GI endoscopy was                         accomplished without difficulty. The patient tolerated                         the procedure well. Findings:      The duodenal bulb, first portion of the duodenum and second portion of       the duodenum were normal. Biopsies for histology were taken with a cold       forceps for evaluation of celiac disease. Estimated blood loss was       minimal.      The entire examined stomach was normal. Biopsies were taken with a cold       forceps for Helicobacter pylori testing. Estimated blood loss was       minimal.      The Z-line was regular. Estimated  blood loss: none.      Esophagogastric landmarks were identified: the gastroesophageal junction       was found at 35 cm from the incisors.      The exam of the esophagus was otherwise normal. Impression:            - Normal duodenal bulb, first portion of the duodenum                         and second portion of the duodenum. Biopsied.                        - Normal stomach. Biopsied.                        - Z-line regular.                        - Esophagogastric landmarks identified. Recommendation:        - Patient has a contact number available for                         emergencies. The signs and symptoms of potential                         delayed complications were discussed with the patient.                         Return to normal activities tomorrow. Written                         discharge instructions were provided to the patient.                        - Discharge patient to home.                        - Resume previous diet.                        -  Continue present medications.                        - Await pathology results.                        - Return to GI clinic as previously scheduled.                        - The findings and recommendations were discussed with                         the patient. Procedure Code(s):     --- Professional ---                        [phone], Esophagogastroduodenoscopy, flexible,                         transoral; with biopsy, single or multiple Diagnosis Code(s):     --- Professional ---                        D50.9, Iron deficiency anemia, unspecified                        R63.4, Abnormal weight loss CPT copyright 2022 American Medical Association. All rights reserved. The codes documented in this report are preliminary and upon coder review may  be revised to meet current compliance requirements. Attending Participation:      I personally performed the entire procedure. Volney American, DO Annamaria Helling DO,  DO 01/20/2022 9:44:24 AM This report has been signed electronically. Number of Addenda: 0 Note Initiated On: 01/20/2022 9:01 AM Estimated Blood Loss:  Estimated blood loss was minimal.      [facility]

## 2022-01-20 NOTE — Anesthesia Postprocedure Evaluation (Signed)
Anesthesia Post Note  Patient: Michelle Johns  Procedure(s) Performed: COLONOSCOPY ESOPHAGOGASTRODUODENOSCOPY (EGD)  Patient location during evaluation: Endoscopy Anesthesia Type: General Level of consciousness: awake and alert Pain management: pain level controlled Vital Signs Assessment: post-procedure vital signs reviewed and stable Respiratory status: spontaneous breathing, nonlabored ventilation, respiratory function stable and patient connected to nasal cannula oxygen Cardiovascular status: blood pressure returned to baseline and stable Postop Assessment: no apparent nausea or vomiting Anesthetic complications: no   No notable events documented.   Last Vitals:  Vitals:   01/20/22 0839 01/20/22 0941  BP: 114/63 (!) 105/48  Pulse: 94   Resp: 18   Temp: (!) 36.3 C (!) 36.1 C  SpO2: 99%     Last Pain:  Vitals:   01/20/22 1001  TempSrc:   PainSc: 0-No pain                 Precious Haws Annalese Stiner

## 2022-01-23 ENCOUNTER — Encounter: Payer: Self-pay | Admitting: Gastroenterology

## 2022-01-23 LAB — SURGICAL PATHOLOGY

## 2022-02-01 ENCOUNTER — Encounter: Payer: Self-pay | Admitting: Gastroenterology

## 2022-02-01 NOTE — H&P (Signed)
Pre-Procedure H&P   Patient ID: Michelle Johns is a 76 y.o. female.  Gastroenterology Provider: Annamaria Helling, DO  Referring Provider: Laurine Blazer, PA PCP: Rusty Aus, MD  Date: 02/02/2022  HPI Ms. Michelle Johns is a 76 y.o. female who presents today for Colonoscopy for Chronic diarrhea, weight loss, anemia .   Starting in November the patient has had loose stools multiple times a day with nocturnal awakenings.  She notes blood with their bowel movements as well- mostly on tissue paper.  She is currently had 10 pounds of unintentional weight loss.  Also notes night sweats (these have improved).    No melena.  Colonoscopy was attempted on December 15 but poor prep including solid stool was noted and procedure was aborted   CT performed demonstrating sigmoid diverticulosis with some thickening in the sigmoid area.  Also noted adenopathy and increased stool burden.   Infectious stool studies have been negative.  Fecal calprotectin 2290 CRP 57 sed rate 65 ferritin 341 iron sat 14 TIBC 226 hemoglobin 10.2 MCV 94 platelets 221,000   2013 history of breast cancer.   Underwent colonoscopy in August 2018 which was negative.  Also had a negative colonoscopy in 2007.   Past Medical History:  Diagnosis Date   Breast cancer ([place]) 2013   left breast cancer   Cancer of left breast ([place])    left 2013 with chemo and radiation therapy   Combined hyperlipidemia    Hypothyroidism    Personal history of chemotherapy 2013   left breast ca   Personal history of radiation therapy 2013   LEFT lumpectomy w/ radiation    Past Surgical History:  Procedure Laterality Date   BREAST EXCISIONAL BIOPSY Left 2013   positive,2013 with chemo and radiation therapy   BREAST LUMPECTOMY Left 2013   w/ radiation and chemo   COLONOSCOPY N/A 01/20/2022   Procedure: COLONOSCOPY;  Surgeon: Annamaria Helling, DO;  Location[facility] ENDOSCOPY;  Service: Gastroenterology;  Laterality: N/A;    COLONOSCOPY WITH PROPOFOL N/A 09/21/2016   Procedure: COLONOSCOPY WITH PROPOFOL;  Surgeon: Lollie Sails, MD;  Location: [facility] ENDOSCOPY;  Service: Endoscopy;  Laterality: N/A;   ESOPHAGOGASTRODUODENOSCOPY N/A 01/20/2022   Procedure: ESOPHAGOGASTRODUODENOSCOPY (EGD);  Surgeon: Annamaria Helling, DO;  Location: [facility] ENDOSCOPY;  Service: Gastroenterology;  Laterality: N/A;   TUBAL LIGATION      Family History No h/o GI disease or malignancy  Review of Systems  Constitutional:  Positive for unexpected weight change. Negative for activity change, appetite change, chills, diaphoresis, fatigue and fever.  HENT:  Negative for trouble swallowing and voice change.   Respiratory:  Negative for shortness of breath and wheezing.   Cardiovascular:  Negative for chest pain, palpitations and leg swelling.  Gastrointestinal:  Positive for blood in stool and diarrhea. Negative for abdominal distention, abdominal pain, anal bleeding, constipation, nausea, rectal pain and vomiting.  Musculoskeletal:  Negative for arthralgias and myalgias.  Skin:  Negative for color change and pallor.  Neurological:  Negative for dizziness, syncope and weakness.  Psychiatric/Behavioral:  Negative for confusion.   All other systems reviewed and are negative.    Medications No current facility-administered medications on file prior to encounter.   Current Outpatient Medications on File Prior to Encounter  Medication Sig Dispense Refill   Cholecalciferol (VITAMIN D-1000 MAX ST) 1000 UNITS tablet Take 1,000 Units by mouth daily.      CRESTOR 10 MG tablet Take 10 mg by mouth daily.  Cyanocobalamin (VITAMIN B-12) 5000 MCG LOZG Take 2,500 mcg by mouth once a week.     levothyroxine (SYNTHROID, LEVOTHROID) 100 MCG tablet Take 100 mcg by mouth daily before breakfast.       Pertinent medications related to GI and procedure were reviewed by me with the patient prior to the procedure   Current Facility-Administered  Medications:    0.9 %  sodium chloride infusion, , Intravenous, Continuous, Annamaria Helling, DO, Last Rate: 20 mL/hr at 02/02/22 2956, Continued from Pre-op at 02/02/22 2130      Allergies  Allergen Reactions   Other Other (See Comments)    Pt took herceptin, taxotere, and carboplatin 06/2011 and was in hospital for several days with fever 104. Dr. Oliva Bustard at the time did not know what chemo she had reaction to but was started on another treatment thereafter.   Allergies were reviewed by me prior to the procedure  Objective   Body mass index is 21.3 kg/m. Vitals:   02/02/22 0749  BP: (!) 110/56  Pulse: 80  Resp: 18  Temp: 98.2 F (36.8 C)  TempSrc: Temporal  SpO2: 100%  Weight: 58.1 kg  Height: '5\' 5"'$  (1.651 m)     Physical Exam Vitals and nursing note reviewed.  Constitutional:      General: She is not in acute distress.    Appearance: She is not ill-appearing, toxic-appearing or diaphoretic.     Comments: Thin appearing  HENT:     Head: Normocephalic and atraumatic.     Nose: Nose normal.     Mouth/Throat:     Mouth: Mucous membranes are moist.     Pharynx: Oropharynx is clear.  Eyes:     General: No scleral icterus.    Extraocular Movements: Extraocular movements intact.  Cardiovascular:     Rate and Rhythm: Normal rate and regular rhythm.     Heart sounds: Murmur heard.     No friction rub. No gallop.  Pulmonary:     Effort: Pulmonary effort is normal. No respiratory distress.     Breath sounds: Normal breath sounds. No wheezing, rhonchi or rales.  Abdominal:     General: Abdomen is flat. Bowel sounds are normal. There is no distension.     Palpations: Abdomen is soft.     Tenderness: There is no abdominal tenderness. There is no guarding or rebound.  Musculoskeletal:     Cervical back: Neck supple.     Right lower leg: No edema.     Left lower leg: No edema.  Skin:    General: Skin is warm and dry.     Coloration: Skin is not jaundiced or pale.   Neurological:     General: No focal deficit present.     Mental Status: She is alert and oriented to person, place, and time. Mental status is at baseline.  Psychiatric:        Mood and Affect: Mood normal.        Behavior: Behavior normal.        Thought Content: Thought content normal.        Judgment: Judgment normal.      Assessment:  Michelle Johns is a 76 y.o. female  who presents today for Colonoscopy for Chronic diarrhea, weight loss, anemia.  Plan:  Colonoscopy with possible intervention today  Colonoscopy with possible biopsy, control of bleeding, polypectomy, and interventions as necessary has been discussed with the patient/patient representative. Informed consent was obtained from the patient/patient representative after  explaining the indication, nature, and risks of the procedure including but not limited to death, bleeding, perforation, missed neoplasm/lesions, cardiorespiratory compromise, and reaction to medications. Opportunity for questions was given and appropriate answers were provided. Patient/patient representative has verbalized understanding is amenable to undergoing the procedure.   Annamaria Helling, DO  [facility]astroenterology  Portions of the record may have been created with voice recognition software. Occasional wrong-word or 'sound-a-like' substitutions may have occurred due to the inherent limitations of voice recognition software.  Read the chart carefully and recognize, using context, where substitutions may have occurred.

## 2022-02-02 ENCOUNTER — Encounter: Payer: Self-pay | Admitting: Gastroenterology

## 2022-02-02 ENCOUNTER — Other Ambulatory Visit: Payer: Self-pay

## 2022-02-02 ENCOUNTER — Ambulatory Visit
Admission: RE | Admit: 2022-02-02 | Discharge: 2022-02-02 | Disposition: A | Discharge status: Home / Self Care | Payer: Medicare PPO | Attending: Gastroenterology | Admitting: Gastroenterology

## 2022-02-02 ENCOUNTER — Ambulatory Visit: Payer: Medicare PPO | Admitting: Anesthesiology

## 2022-02-02 ENCOUNTER — Encounter
Admission: RE | Disposition: A | Discharge status: Home / Self Care | Payer: Self-pay | Source: Home / Self Care | Attending: Gastroenterology

## 2022-02-02 DIAGNOSIS — Z853 Personal history of malignant neoplasm of breast: Secondary | ICD-10-CM | POA: Insufficient documentation

## 2022-02-02 DIAGNOSIS — R634 Abnormal weight loss: Secondary | ICD-10-CM | POA: Diagnosis not present

## 2022-02-02 DIAGNOSIS — K6289 Other specified diseases of anus and rectum: Secondary | ICD-10-CM | POA: Diagnosis not present

## 2022-02-02 DIAGNOSIS — Z9221 Personal history of antineoplastic chemotherapy: Secondary | ICD-10-CM | POA: Diagnosis not present

## 2022-02-02 DIAGNOSIS — E782 Mixed hyperlipidemia: Secondary | ICD-10-CM | POA: Insufficient documentation

## 2022-02-02 DIAGNOSIS — K921 Melena: Secondary | ICD-10-CM | POA: Diagnosis not present

## 2022-02-02 DIAGNOSIS — Z6821 Body mass index (BMI) 21.0-21.9, adult: Secondary | ICD-10-CM | POA: Diagnosis not present

## 2022-02-02 DIAGNOSIS — E039 Hypothyroidism, unspecified: Secondary | ICD-10-CM | POA: Diagnosis not present

## 2022-02-02 DIAGNOSIS — K5730 Diverticulosis of large intestine without perforation or abscess without bleeding: Secondary | ICD-10-CM | POA: Diagnosis not present

## 2022-02-02 DIAGNOSIS — Z923 Personal history of irradiation: Secondary | ICD-10-CM | POA: Insufficient documentation

## 2022-02-02 DIAGNOSIS — K5190 Ulcerative colitis, unspecified, without complications: Secondary | ICD-10-CM | POA: Insufficient documentation

## 2022-02-02 DIAGNOSIS — D649 Anemia, unspecified: Secondary | ICD-10-CM | POA: Insufficient documentation

## 2022-02-02 DIAGNOSIS — K529 Noninfective gastroenteritis and colitis, unspecified: Secondary | ICD-10-CM | POA: Diagnosis present

## 2022-02-02 HISTORY — PX: COLONOSCOPY: SHX5424

## 2022-02-02 SURGERY — COLONOSCOPY
Anesthesia: General

## 2022-02-02 MED ORDER — SODIUM CHLORIDE 0.9 % IV SOLN: INTRAVENOUS | Status: DC

## 2022-02-02 MED ORDER — LIDOCAINE HCL (CARDIAC) PF 100 MG/5ML IV SOSY
PREFILLED_SYRINGE | INTRAVENOUS | Status: DC | PRN
  Administered 2022-02-02: 50 mg via INTRAVENOUS

## 2022-02-02 MED ORDER — PROPOFOL 500 MG/50ML IV EMUL
INTRAVENOUS | Status: DC | PRN
  Administered 2022-02-02: 150 ug/kg/min via INTRAVENOUS

## 2022-02-02 MED ORDER — PROPOFOL 10 MG/ML IV BOLUS
INTRAVENOUS | Status: DC | PRN
  Administered 2022-02-02: 60 mg via INTRAVENOUS

## 2022-02-02 NOTE — Anesthesia Preprocedure Evaluation (Signed)
Anesthesia Evaluation  Patient identified by MRN, date of birth, ID band Patient awake    Reviewed: Allergy & Precautions, NPO status , Patient's Chart, lab work & pertinent test results  History of Anesthesia Complications Negative for: history of anesthetic complications  Airway Mallampati: III  TM Distance: <3 FB Neck ROM: full    Dental  (+) Chipped   Pulmonary neg pulmonary ROS, neg shortness of breath   Pulmonary exam normal        Cardiovascular Exercise Tolerance: Good (-) angina negative cardio ROS Normal cardiovascular exam     Neuro/Psych negative neurological ROS  negative psych ROS   GI/Hepatic negative GI ROS, Neg liver ROS,neg GERD  ,,  Endo/Other  Hypothyroidism    Renal/GU negative Renal ROS  negative genitourinary   Musculoskeletal   Abdominal   Peds  Hematology negative hematology ROS (+)   Anesthesia Other Findings Past Medical History: 2013: Breast cancer ([place])     Comment:  left breast cancer No date: Cancer of left breast ([place])     Comment:  left 2013 with chemo and radiation therapy 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 No date: Hypothyroidism 2013: Personal history of chemotherapy     Comment:  left breast ca 2013: Personal history of radiation therapy     Comment:  LEFT lumpectomy w/ radiation  Past Surgical History: 2013: BREAST EXCISIONAL BIOPSY; Left     Comment:  positive,2013 with chemo and radiation therapy 2013: BREAST LUMPECTOMY; Left     Comment:  w/ radiation and chemo 09/21/2016: COLONOSCOPY WITH PROPOFOL; N/A     Comment:  Procedure: COLONOSCOPY WITH PROPOFOL;  Surgeon:               Lollie Sails, MD;  Location: [facility] ENDOSCOPY;                Service: Endoscopy;  Laterality: N/A; No date: TUBAL LIGATION  BMI    Body Mass Index: 21.63 kg/m      Reproductive/Obstetrics negative OB ROS                              Anesthesia  Physical Anesthesia Plan  ASA: 2  Anesthesia Plan: General   Post-op Pain Management:    Induction: Intravenous  PONV Risk Score and Plan: Propofol infusion and TIVA  Airway Management Planned: Natural Airway and Nasal Cannula  Additional Equipment:   Intra-op Plan:   Post-operative Plan:   Informed Consent: I have reviewed the patients History and Physical, chart, labs and discussed the procedure including the risks, benefits and alternatives for the proposed anesthesia with the patient or authorized representative who has indicated his/her understanding and acceptance.     Dental Advisory Given  Plan Discussed with: Anesthesiologist, CRNA and Surgeon  Anesthesia Plan Comments: (Patient consented for risks of anesthesia including but not limited to:  - adverse reactions to medications - risk of airway placement if required - damage to eyes, teeth, lips or other oral mucosa - nerve damage due to positioning  - sore throat or hoarseness - Damage to heart, brain, nerves, lungs, other parts of body or loss of life  Patient voiced understanding.)        Anesthesia Quick Evaluation

## 2022-02-02 NOTE — Interval H&P Note (Signed)
History and Physical Interval Note: Preprocedure H&P from 02/02/22  was reviewed and there was no interval change after seeing and examining the patient.  Written consent was obtained from the patient after discussion of risks, benefits, and alternatives. Patient has consented to proceed with Colonoscopy with possible intervention   02/02/2022 8:44 AM  Michelle Johns  has presented today for surgery, with the diagnosis of Chronic anemia, unspecified (D64.9).  The various methods of treatment have been discussed with the patient and family. After consideration of risks, benefits and other options for treatment, the patient has consented to  Procedure(s): COLONOSCOPY (N/A) as a surgical intervention.  The patient's history has been reviewed, patient examined, no change in status, stable for surgery.  I have reviewed the patient's chart and labs.  Questions were answered to the patient's satisfaction.     Annamaria Helling

## 2022-02-02 NOTE — Op Note (Signed)
[facility] Gastroenterology Patient Name: Michelle Johns Procedure Date: 02/02/2022 8:44 AM MRN: [id number] Account #: [id number] Date of Birth: 06-23-1945 Admit Type: Outpatient Age: 76 Room: [facility] ENDO ROOM 1 Gender: Female Note Status: Finalized Instrument Name: Peds Colonoscope [id number] Procedure:             Colonoscopy Indications:           Clinically significant diarrhea of unexplained origin,                         Hematochezia Providers:             Rueben Bash, DO Referring MD:          Rusty Aus, MD (Referring MD) Medicines:             Monitored Anesthesia Care Complications:         No immediate complications. Estimated blood loss:                         Minimal. Procedure:             Pre-Anesthesia Assessment:                        - Prior to the procedure, a History and Physical was                         performed, and patient medications and allergies were                         reviewed. The patient is competent. The risks and                         benefits of the procedure and the sedation options and                         risks were discussed with the patient. All questions                         were answered and informed consent was obtained.                         Patient identification and proposed procedure were                         verified by the physician, the nurse, the anesthetist                         and the technician in the endoscopy suite. Mental                         Status Examination: alert and oriented. Airway                         Examination: normal oropharyngeal airway and neck                         mobility. Respiratory Examination: clear to  auscultation. CV Examination: RRR, no murmurs, no S3                         or S4. Prophylactic Antibiotics: The patient does not                         require prophylactic antibiotics. Prior                          Anticoagulants: The patient has taken no anticoagulant                         or antiplatelet agents. ASA Grade Assessment: II - A                         patient with mild systemic disease. After reviewing                         the risks and benefits, the patient was deemed in                         satisfactory condition to undergo the procedure. The                         anesthesia plan was to use monitored anesthesia care                         (MAC). Immediately prior to administration of                         medications, the patient was re-assessed for adequacy                         to receive sedatives. The heart rate, respiratory                         rate, oxygen saturations, blood pressure, adequacy of                         pulmonary ventilation, and response to care were                         monitored throughout the procedure. The physical                         status of the patient was re-assessed after the                         procedure.                        After obtaining informed consent, the colonoscope was                         passed under direct vision. Throughout the procedure,                         the patient's blood pressure, pulse, and oxygen  saturations were monitored continuously. The                         Colonoscope was introduced through the anus and                         advanced to the the sigmoid colon. The colonoscopy was                         technically difficult and complex due to a partially                         obstructing mass. The patient tolerated the procedure                         well. The quality of the bowel preparation was                         adequate to identify polyps. The rectum was                         photographed. Findings:      The digital rectal exam findings include palpable rectal mass.      A frond-like/villous, fungating and ulcerated completely obstructing        large mass was found in the sigmoid colon. The mass was circumferential.       The mass measured at least 10cm, unable to traverse cm in length. In       addition, its diameter measured three mm. Oozing was present. Biopsies       were taken with a cold forceps for histology. Estimated blood loss was       minimal. Area was tattooed with an injection of 2 mL of [place] ink.       Estimated blood loss was minimal. Mass located starting approximately 28       cm from the anus.      A localized area of granular mucosa was found in the rectum. Biopsies       were taken with a cold forceps for histology. Estimated blood loss was       minimal.      Multiple small-mouthed diverticula were found in the recto-sigmoid colon       and sigmoid colon. Impression:            - Palpable rectal mass found on digital rectal exam.                        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 Biopsied. Tattooed.                        - Granularity in the rectum. Biopsied.                        - Diverticulosis in the recto-sigmoid colon and in the                         sigmoid colon. Recommendation:        -  Patient has a contact number available for                         emergencies. The signs and symptoms of potential                         delayed complications were discussed with the patient.                         Return to normal activities tomorrow. Written                         discharge instructions were provided to the patient.                        - Discharge patient to home.                        - Resume previous diet.                        - Continue present medications.                        - Await pathology results.                        - Repeat colonoscopy because the examination was                         incomplete.                        - Return to GI office as previously scheduled.                        -  Refer to a surgeon at appointment to be scheduled.                        - The findings and recommendations were discussed with                         the patient.                        - The findings and recommendations were discussed with                         the patient's family.                        - Will plan to order labs and imaging. Procedure Code(s):     --- Professional ---                        [phone], 52, Colonoscopy, flexible; with biopsy, single                         or multiple                        [id number], 52, Colonoscopy, flexible; with directed  submucosal injection(s), any substance Diagnosis Code(s):     --- Professional ---                        K62.89, Other specified diseases of anus and rectum                        D49.0, Neoplasm of unspecified behavior of digestive                         system                        K56.691, Other complete intestinal obstruction                        R19.7, Diarrhea, unspecified                        K92.1, Melena (includes Hematochezia)                        K57.30, Diverticulosis of large intestine without                         perforation or abscess without bleeding CPT copyright 2022 American Medical Association. All rights reserved. The codes documented in this report are preliminary and upon coder review may  be revised to meet current compliance requirements. Attending Participation:      I personally performed the entire procedure. Volney American, DO Annamaria Helling DO, DO 02/02/2022 9:22:03 AM This report has been signed electronically. Number of Addenda: 0 Note Initiated On: 02/02/2022 8:44 AM Scope Withdrawal Time: 0 hours 2 minutes 18 seconds  Total Procedure Duration: 0 hours 14 minutes 45 seconds  Estimated Blood Loss:  Estimated blood loss was minimal.      [facility]

## 2022-02-02 NOTE — Anesthesia Procedure Notes (Signed)
Procedure Name: MAC Date/Time: 02/02/2022 8:46 AM  Performed by: Biagio Borg, CRNAPre-anesthesia Checklist: Patient identified, Emergency Drugs available, Suction available, Patient being monitored and Timeout performed Patient Re-evaluated:Patient Re-evaluated prior to induction Oxygen Delivery Method: Nasal cannula Induction Type: IV induction Placement Confirmation: positive ETCO2 and CO2 detector

## 2022-02-02 NOTE — Anesthesia Postprocedure Evaluation (Signed)
Anesthesia Post Note  Patient: Michelle Johns  Procedure(s) Performed: COLONOSCOPY  Patient location during evaluation: Endoscopy Anesthesia Type: General Level of consciousness: awake and alert Pain management: pain level controlled Vital Signs Assessment: post-procedure vital signs reviewed and stable Respiratory status: spontaneous breathing, nonlabored ventilation, respiratory function stable and patient connected to nasal cannula oxygen Cardiovascular status: blood pressure returned to baseline and stable Postop Assessment: no apparent nausea or vomiting Anesthetic complications: no  No notable events documented.   Last Vitals:  Vitals:   02/02/22 0931 02/02/22 0941  BP: 117/62 104/64  Pulse:    Resp:    Temp:    SpO2:  100%    Last Pain:  Vitals:   02/02/22 0911  TempSrc: Temporal  PainSc: Asleep                 Ilene Qua

## 2022-02-02 NOTE — Transfer of Care (Signed)
Immediate Anesthesia Transfer of Care Note  Patient: Michelle Johns  Procedure(s) Performed: COLONOSCOPY  Patient Location: PACU and Endoscopy Unit  Anesthesia Type:General  Level of Consciousness: sedated  Airway & Oxygen Therapy: Patient Spontanous Breathing  Post-op Assessment: Report given to RN and Post -op Vital signs reviewed and stable  Post vital signs: Reviewed and stable  Last Vitals:  Vitals Value Taken Time  BP    Temp    Pulse    Resp    SpO2      Last Pain:  Vitals:   02/02/22 0749  TempSrc: Temporal  PainSc: 0-No pain         Complications: No notable events documented.

## 2022-02-03 ENCOUNTER — Encounter: Payer: Self-pay | Admitting: Gastroenterology

## 2022-02-03 ENCOUNTER — Other Ambulatory Visit: Payer: Self-pay | Admitting: Gastroenterology

## 2022-02-03 DIAGNOSIS — C187 Malignant neoplasm of sigmoid colon: Secondary | ICD-10-CM

## 2022-02-07 ENCOUNTER — Ambulatory Visit
Admission: RE | Admit: 2022-02-07 | Discharge: 2022-02-07 | Disposition: A | Discharge status: Home / Self Care | Payer: Medicare PPO | Source: Ambulatory Visit | Attending: Gastroenterology | Admitting: Gastroenterology

## 2022-02-07 DIAGNOSIS — C187 Malignant neoplasm of sigmoid colon: Secondary | ICD-10-CM | POA: Insufficient documentation

## 2022-02-07 LAB — SURGICAL PATHOLOGY

## 2022-02-07 MED ORDER — IOHEXOL 300 MG/ML  SOLN
100.0000 mL | Freq: Once | INTRAMUSCULAR | Status: AC | PRN
  Administered 2022-02-07: 100 mL via INTRAVENOUS

## 2022-02-08 DIAGNOSIS — K6389 Other specified diseases of intestine: Secondary | ICD-10-CM | POA: Insufficient documentation

## 2022-02-13 DIAGNOSIS — R911 Solitary pulmonary nodule: Secondary | ICD-10-CM | POA: Insufficient documentation

## 2022-02-17 HISTORY — PX: COLON SURGERY: SHX602

## 2022-03-03 DIAGNOSIS — Z932 Ileostomy status: Secondary | ICD-10-CM | POA: Insufficient documentation

## 2022-04-28 ENCOUNTER — Other Ambulatory Visit: Payer: Self-pay

## 2022-04-28 DIAGNOSIS — Z1231 Encounter for screening mammogram for malignant neoplasm of breast: Secondary | ICD-10-CM

## 2022-06-29 ENCOUNTER — Ambulatory Visit
Admission: RE | Admit: 2022-06-29 | Discharge: 2022-06-29 | Disposition: A | Discharge status: Home / Self Care | Payer: Medicare PPO | Source: Ambulatory Visit | Attending: Internal Medicine | Admitting: Internal Medicine

## 2022-06-29 DIAGNOSIS — Z1231 Encounter for screening mammogram for malignant neoplasm of breast: Secondary | ICD-10-CM | POA: Insufficient documentation

## 2022-07-17 ENCOUNTER — Other Ambulatory Visit: Payer: Self-pay | Admitting: Physician Assistant

## 2022-07-17 ENCOUNTER — Ambulatory Visit
Admission: RE | Admit: 2022-07-17 | Discharge: 2022-07-17 | Disposition: A | Discharge status: Home / Self Care | Payer: Medicare PPO | Source: Ambulatory Visit | Attending: Physician Assistant | Admitting: Physician Assistant

## 2022-07-17 DIAGNOSIS — R270 Ataxia, unspecified: Secondary | ICD-10-CM | POA: Insufficient documentation

## 2022-07-25 DIAGNOSIS — Z9889 Other specified postprocedural states: Secondary | ICD-10-CM | POA: Insufficient documentation

## 2022-08-01 ENCOUNTER — Inpatient Hospital Stay: Payer: Medicare PPO

## 2022-08-01 ENCOUNTER — Inpatient Hospital Stay: Payer: Medicare PPO | Attending: Internal Medicine | Admitting: Internal Medicine

## 2022-08-01 VITALS — BP 95/59 | HR 81 | Temp 97.8°F | Wt 128.7 lb

## 2022-08-01 DIAGNOSIS — E039 Hypothyroidism, unspecified: Secondary | ICD-10-CM | POA: Insufficient documentation

## 2022-08-01 DIAGNOSIS — Z79890 Hormone replacement therapy (postmenopausal): Secondary | ICD-10-CM | POA: Diagnosis not present

## 2022-08-01 DIAGNOSIS — Z853 Personal history of malignant neoplasm of breast: Secondary | ICD-10-CM | POA: Diagnosis not present

## 2022-08-01 DIAGNOSIS — Z79899 Other long term (current) drug therapy: Secondary | ICD-10-CM | POA: Diagnosis not present

## 2022-08-01 DIAGNOSIS — D509 Iron deficiency anemia, unspecified: Secondary | ICD-10-CM

## 2022-08-01 DIAGNOSIS — Z803 Family history of malignant neoplasm of breast: Secondary | ICD-10-CM | POA: Diagnosis not present

## 2022-08-01 DIAGNOSIS — I6529 Occlusion and stenosis of unspecified carotid artery: Secondary | ICD-10-CM | POA: Insufficient documentation

## 2022-08-01 DIAGNOSIS — K5120 Ulcerative (chronic) proctitis without complications: Secondary | ICD-10-CM | POA: Insufficient documentation

## 2022-08-01 DIAGNOSIS — Z923 Personal history of irradiation: Secondary | ICD-10-CM | POA: Insufficient documentation

## 2022-08-01 DIAGNOSIS — E782 Mixed hyperlipidemia: Secondary | ICD-10-CM | POA: Diagnosis not present

## 2022-08-01 DIAGNOSIS — Z9221 Personal history of antineoplastic chemotherapy: Secondary | ICD-10-CM | POA: Diagnosis not present

## 2022-08-01 DIAGNOSIS — D649 Anemia, unspecified: Secondary | ICD-10-CM

## 2022-08-01 DIAGNOSIS — K5720 Diverticulitis of large intestine with perforation and abscess without bleeding: Secondary | ICD-10-CM | POA: Insufficient documentation

## 2022-08-01 DIAGNOSIS — R634 Abnormal weight loss: Secondary | ICD-10-CM | POA: Diagnosis not present

## 2022-08-01 LAB — RETICULOCYTES
Immature Retic Fract: 9.3 % (ref 2.3–15.9)
RBC.: 3.19 MIL/uL — ABNORMAL LOW (ref 3.87–5.11)
Retic Count, Absolute: 68.3 10*3/uL (ref 19.0–186.0)
Retic Ct Pct: 2.1 % (ref 0.4–3.1)

## 2022-08-01 LAB — FERRITIN: Ferritin: 24 ng/mL (ref 11–307)

## 2022-08-01 LAB — CBC WITH DIFFERENTIAL/PLATELET
Abs Immature Granulocytes: 0.05 10*3/uL (ref 0.00–0.07)
Basophils Absolute: 0 10*3/uL (ref 0.0–0.1)
Basophils Relative: 0 %
Eosinophils Absolute: 0.1 10*3/uL (ref 0.0–0.5)
Eosinophils Relative: 2 %
HCT: 31.6 % — ABNORMAL LOW (ref 36.0–46.0)
Hemoglobin: 10.2 g/dL — ABNORMAL LOW (ref 12.0–15.0)
Immature Granulocytes: 1 %
Lymphocytes Relative: 24 %
Lymphs Abs: 1.4 10*3/uL (ref 0.7–4.0)
MCH: 31.7 pg (ref 26.0–34.0)
MCHC: 32.3 g/dL (ref 30.0–36.0)
MCV: 98.1 fL (ref 80.0–100.0)
Monocytes Absolute: 0.4 10*3/uL (ref 0.1–1.0)
Monocytes Relative: 6 %
Neutro Abs: 3.9 10*3/uL (ref 1.7–7.7)
Neutrophils Relative %: 67 %
Platelets: 189 10*3/uL (ref 150–400)
RBC: 3.22 MIL/uL — ABNORMAL LOW (ref 3.87–5.11)
RDW: 12.6 % (ref 11.5–15.5)
WBC: 5.8 10*3/uL (ref 4.0–10.5)
nRBC: 0 % (ref 0.0–0.2)

## 2022-08-01 LAB — C-REACTIVE PROTEIN: CRP: 1.2 mg/dL — ABNORMAL HIGH (ref <1.0)

## 2022-08-01 LAB — IRON AND TIBC
Iron: 148 ug/dL (ref 28–170)
Saturation Ratios: 39 % — ABNORMAL HIGH (ref 10.4–31.8)
TIBC: 382 ug/dL (ref 250–450)
UIBC: 234 ug/dL

## 2022-08-01 LAB — FOLATE: Folate: >40 ng/mL (ref >5.9)

## 2022-08-01 LAB — SEDIMENTATION RATE: Sed Rate: 16 mm/hr (ref 0–30)

## 2022-08-01 NOTE — Progress Notes (Signed)
[facility]  [phone] Fax:([phone]) [phone]  ID: Michelle Johns OB: 10-Apr-1945  MR#: [id number]  JKK#:[id number]  Patient Care Team: Danella Penton, MD as PCP - General (Internal Medicine) Nadeen Landau, MD (Inactive) as Referring Physician (Surgery)  REFERRING PROVIDER: Dr. Hyacinth Meeker  REASON FOR REFERRAL: Anemia  HPI: Michelle Johns is a 77 y.o. female with past medical history of left breast cancer status postsurgery chemo RT in 2013, hypothyroidism, sigmoid colectomy was referred to hematology for workup of anemia.  Patient developed anemia in November 2023 with hemoglobin of 10.2.  MCV 94.7.  WBC 8.5 and platelets of 291.  Ferritin 341.  Iron panel consistent with anemia of chronic disease.  Weight loss of 25 lbs in 1 month. Had CT abdomen pelvis for diarrhea on 01/05/2022 which showed sigmoid diverticulosis with sigmoid mucosal thickening.  Endoscopic correlation recommended to exclude neoplastic etiology.  Sigmoid mesenteric adenopathy. Had colonoscopy with Dr. Timothy Lasso in December 2023 which showed fungating and ulcerated completely obstructing large mass in the sigmoid colon.  Measured at least 10 cm.  Localized area of granular mucosa found in the rectum.  Upper endoscopy was normal.  Surgical pathology of the sigmoid colon mass showed moderate chronic active colitis with focal ulceration and inflamed granulation tissue.  Negative for granuloma, dysplasia and malignancy.  Rectal biopsy showing mildly active proctitis. Had second opinion surgical path at [facility] which agreed with previous findings.   She has history of sigmoid colectomy with colorectal anastomosis and loop ileostomy in January 2024 at [facility].  Had ileostomy takedown in April 2024.  Surgical Path-02/2022-A: Sigmoid colon, partial colectomy - Segment of colon with extensive ulcer, transmural dense acute and chronic inflammation with microscopic evidence of perforation and diverticular formation -  No definite infectious microorganisms or viral cytopathic effect identified - No evidence of dysplasia or malignancy  - Viable surgical margins with active inflammation - 6 benign lymph nodes   From March 2024 B12 level 315.  From 07/18/2022 ferritin 22, hemoglobin 9.7 CRP level elevated to 18.  She was treated with oral iron for 2 months with no improvement in hemoglobin.  Has history of HER2 positive breast cancer treated in 2013 by Dr. Doylene Canning.   REVIEW OF SYSTEMS:   ROS  As per HPI. Otherwise, a complete review of systems is negative.  PAST MEDICAL HISTORY: Past Medical History:  Diagnosis Date   Breast cancer (HCC) 2013   left breast cancer   Cancer of left breast (HCC)    left 2013 with chemo and radiation therapy   Combined hyperlipidemia    Hypothyroidism    Personal history of chemotherapy 2013   left breast ca   Personal history of radiation therapy 2013   LEFT lumpectomy w/ radiation    PAST SURGICAL HISTORY: Past Surgical History:  Procedure Laterality Date   BREAST EXCISIONAL BIOPSY Left 2013   positive,2013 with chemo and radiation therapy   BREAST LUMPECTOMY Left 2013   w/ radiation and chemo   COLONOSCOPY N/A 01/20/2022   Procedure: COLONOSCOPY;  Surgeon: Jaynie Collins, DO;  Location: [facility] ENDOSCOPY;  Service: Gastroenterology;  Laterality: N/A;   COLONOSCOPY N/A 02/02/2022   Procedure: COLONOSCOPY;  Surgeon: Jaynie Collins, DO;  Location: [facility] ENDOSCOPY;  Service: Gastroenterology;  Laterality: N/A;   COLONOSCOPY WITH PROPOFOL N/A 09/21/2016   Procedure: COLONOSCOPY WITH PROPOFOL;  Surgeon: Christena Deem, MD;  Location: [facility] ENDOSCOPY;  Service: Endoscopy;  Laterality: N/A;   ESOPHAGOGASTRODUODENOSCOPY N/A 01/20/2022  Procedure: ESOPHAGOGASTRODUODENOSCOPY (EGD);  Surgeon: Jaynie Collins, DO;  Location: [facility] ENDOSCOPY;  Service: Gastroenterology;  Laterality: N/A;   TUBAL LIGATION      FAMILY HISTORY: Family History   Problem Relation Age of Onset   Breast cancer Sister 27    HEALTH MAINTENANCE: Social History   Tobacco Use   Smoking status: Never   Smokeless tobacco: Never  Vaping Use   Vaping Use: Never used  Substance Use Topics   Alcohol use: No    Alcohol/week: 0.0 standard drinks of alcohol   Drug use: No     Allergies  Allergen Reactions   Other Other (See Comments)    Pt took herceptin, taxotere, and carboplatin 06/2011 and was in hospital for several days with fever 104. Dr. Doylene Canning at the time did not know what chemo she had reaction to but was started on another treatment thereafter.    Current Outpatient Medications  Medication Sig Dispense Refill   Cholecalciferol (VITAMIN D-1000 MAX ST) 1000 UNITS tablet Take 1,000 Units by mouth daily.      CRESTOR 10 MG tablet Take 10 mg by mouth daily.      Cyanocobalamin (VITAMIN B-12) 5000 MCG LOZG Take 2,500 mcg by mouth once a week.     levothyroxine (SYNTHROID, LEVOTHROID) 100 MCG tablet Take 100 mcg by mouth daily before breakfast.      No current facility-administered medications for this visit.    OBJECTIVE: Vitals:   08/01/22 1432  BP: (!) 95/59  Pulse: 81  Temp: 97.8 F (36.6 C)  SpO2: 97%     Body mass index is 21.42 kg/m.      General: Well-developed, well-nourished, no acute distress. Eyes: Pink conjunctiva, anicteric sclera. HEENT: Normocephalic, moist mucous membranes, clear oropharnyx. Lungs: Clear to auscultation bilaterally. Heart: Regular rate and rhythm. No rubs, murmurs, or gallops. Abdomen: Soft, nontender, nondistended. No organomegaly noted, normoactive bowel sounds. Musculoskeletal: No edema, cyanosis, or clubbing. Neuro: Alert, answering all questions appropriately. Cranial nerves grossly intact. Skin: No rashes or petechiae noted. Psych: Normal affect. Lymphatics: No cervical, calvicular, axillary or inguinal LAD.   LAB RESULTS:  Lab Results  Component Value Date   NA 139 08/18/2015   K 4.3  08/18/2015   CL 107 08/18/2015   CO2 28 08/18/2015   GLUCOSE 103 (H) 08/18/2015   BUN 16 08/18/2015   CREATININE 0.72 08/18/2015   CALCIUM 9.1 08/18/2015   PROT 6.8 08/18/2015   ALBUMIN 4.1 08/18/2015   AST 22 08/18/2015   ALT 18 08/18/2015   ALKPHOS 75 08/18/2015   BILITOT 0.9 08/18/2015   GFRNONAA >60 08/18/2015   GFRAA >60 08/18/2015    Lab Results  Component Value Date   WBC 5.6 08/18/2015   NEUTROABS 2.9 08/18/2015   HGB 12.3 08/18/2015   HCT 35.0 08/18/2015   MCV 93.1 08/18/2015   PLT 143 (L) 08/18/2015    Lab Results  Component Value Date   TIBC 249 (L) 07/06/2011   FERRITIN 1,035 (H) 07/06/2011   IRONPCTSAT 38 07/06/2011     STUDIES: CT HEAD WO CONTRAST ( )  Result Date: 07/20/2022 CLINICAL DATA:  Pain and tingling in the left neck and left arm. History of breast cancer. EXAM: CT HEAD WITHOUT CONTRAST TECHNIQUE: Contiguous axial images were obtained from the base of the skull through the vertex without intravenous contrast. RADIATION DOSE REDUCTION: This exam was performed according to the departmental dose-optimization program which includes automated exposure control, adjustment of the mA and/or kV according to patient  size and/or use of iterative reconstruction technique. COMPARISON:  None Available. FINDINGS: Brain: There is mild cerebral atrophy with mild-to-moderate small vessel disease of the cerebral white matter with atrophic ventriculomegaly. No focal asymmetry is seen concerning for an acute cortical based infarct, hemorrhage, mass or mass effect. There is no midline shift. The basal cisterns are clear. The brainstem and cerebellum are unremarkable. Vascular: There are scattered calcifications in the carotid siphons. No hyperdense central vessel is seen. Skull: Negative for fractures or focal lesions. Sinuses/Orbits: Negative visualized upper to mid orbits. Clear visualized paranasal sinuses. There is mild fluid in both mastoid tips. Rest of the mastoid air  cells are clear. Other: None. IMPRESSION: 1. No acute intracranial CT findings.  Chronic changes. 2. Carotid atherosclerosis. 3. Mild fluid in both mastoid tips. Electronically Signed   By: Almira Bar M.D.   On: 07/20/2022 00:22    ASSESSMENT AND PLAN:   Michelle Johns is a 77 y.o. female with pmh of  left breast cancer status postsurgery chemo RT in 2013, hypothyroidism, sigmoid colectomy was referred to hematology for workup of anemia.  # Normocytic anemia -Of unknown etiology.  Not responsive to oral iron.  Started in November 2023 ranging between 8-9.  Iron panel then was consistent with anemia of chronic disease.  Ferritin was elevated to ~300 -Most recent ferritin 22.  CRP level was still elevated to 18.  Although ferritin is within the normal range, considering there is some inflammation going on there may be a component of iron deficiency also.  I will repeat ferritin level and iron panel today.  Check B12, folate, reticulocyte count, myeloma panel to complete the workup.  No renal dysfunction.  I will recheck her inflammatory markers. -She is going on vacation this Thursday.  I will schedule her for IV Venofer 200 mg weekly x 5 doses starting next week.  If her blood work is not consistent with iron deficiency I will give her a call to discuss about further steps.  # History of sigmoid colectomy with colorectal anastomosis -In January 2024 at [facility]. - Had colonoscopy with Dr. Timothy Lasso in December 2023 which showed fungating and ulcerated completely obstructing large mass in the sigmoid colon.  Measured at least 10 cm.  Localized area of granular mucosa found in the rectum.  Upper endoscopy was normal.  Surgical pathology of the sigmoid colon mass showed moderate chronic active colitis with focal ulceration and inflamed granulation tissue.  Negative for granuloma, dysplasia and malignancy.  Rectal biopsy showing mildly active proctitis. Had second opinion surgical path at [facility] which agreed with  previous findings.   She has history of sigmoid colectomy with colorectal anastomosis and loop ileostomy in January 2024 at [facility].  Had ileostomy takedown in April 2024.  Surgical Path-02/2022-A: Sigmoid colon, partial colectomy - Segment of colon with extensive ulcer, transmural dense acute and chronic inflammation with microscopic evidence of perforation and diverticular formation - No definite infectious microorganisms or viral cytopathic effect identified - No evidence of dysplasia or malignancy  - Viable surgical margins with active inflammation - 6 benign lymph nodes   -Scheduled for repeat colonoscopy in September 2024.  # History of left breast cancer status post surgery chemo RT in 2013 -Follows with primary.  Last mammo in May 2024 was normal.  Repeat in 1 year.  Orders Placed This Encounter  Procedures   CBC with Differential/Platelet   Iron and TIBC   Ferritin   Vitamin B12   Folate   Multiple  Myeloma Panel (SPEP&IFE w/QIG)   Kappa/lambda light chains   Sedimentation rate   C-reactive protein   Reticulocytes   CBC with Differential/Platelet   Iron and TIBC   Ferritin   RTC in 4 months for MD visit, labs  Patient expressed understanding and was in agreement with this plan. She also understands that She can call clinic at any time with any questions, concerns, or complaints.   I spent a total of 45 minutes reviewing chart data, face-to-face evaluation with the patient, counseling and coordination of care as detailed above.  Michaelyn Barter, MD   08/01/2022 3:24 PM

## 2022-08-02 ENCOUNTER — Telehealth: Payer: Self-pay

## 2022-08-02 LAB — KAPPA/LAMBDA LIGHT CHAINS
Kappa free light chain: 19 mg/L (ref 3.3–19.4)
Kappa, lambda light chain ratio: 1.17 (ref 0.26–1.65)
Lambda free light chains: 16.3 mg/L (ref 5.7–26.3)

## 2022-08-02 NOTE — Telephone Encounter (Addendum)
Spoke with patient to let her know results. She expressed understanding and was ok with appointment in 2 weeks.   ----- Message from Michaelyn Barter, MD sent at 08/02/2022 10:40 AM EDT ----- Please call and inform the patient that inflammatory markers and iron level came back normal. Please cancel iron infusions. Schedule f/u visit with me in 2 weeks.   Thanks Dr. Mervyn Skeeters

## 2022-08-07 LAB — MULTIPLE MYELOMA PANEL, SERUM
Albumin SerPl Elph-Mcnc: 3.6 g/dL (ref 2.9–4.4)
Albumin/Glob SerPl: 1.4 (ref 0.7–1.7)
Alpha 1: 0.2 g/dL (ref 0.0–0.4)
Alpha2 Glob SerPl Elph-Mcnc: 0.7 g/dL (ref 0.4–1.0)
B-Globulin SerPl Elph-Mcnc: 0.9 g/dL (ref 0.7–1.3)
Gamma Glob SerPl Elph-Mcnc: 0.8 g/dL (ref 0.4–1.8)
Globulin, Total: 2.6 g/dL (ref 2.2–3.9)
IgA: 129 mg/dL (ref 64–422)
IgG (Immunoglobin G), Serum: 823 mg/dL (ref 586–1602)
IgM (Immunoglobulin M), Srm: 64 mg/dL (ref 26–217)
Total Protein ELP: 6.2 g/dL (ref 6.0–8.5)

## 2022-08-09 ENCOUNTER — Ambulatory Visit: Payer: Medicare PPO

## 2022-08-11 ENCOUNTER — Ambulatory Visit: Payer: Medicare PPO

## 2022-08-14 ENCOUNTER — Other Ambulatory Visit: Payer: Self-pay | Admitting: *Deleted

## 2022-08-14 DIAGNOSIS — D649 Anemia, unspecified: Secondary | ICD-10-CM

## 2022-08-15 ENCOUNTER — Inpatient Hospital Stay (HOSPITAL_BASED_OUTPATIENT_CLINIC_OR_DEPARTMENT_OTHER): Payer: Medicare PPO | Admitting: Internal Medicine

## 2022-08-15 ENCOUNTER — Ambulatory Visit: Payer: Medicare PPO

## 2022-08-15 ENCOUNTER — Inpatient Hospital Stay: Payer: Medicare PPO | Attending: Internal Medicine

## 2022-08-15 VITALS — HR 61 | Resp 18 | Ht 65.0 in | Wt 129.7 lb

## 2022-08-15 DIAGNOSIS — Z923 Personal history of irradiation: Secondary | ICD-10-CM | POA: Diagnosis not present

## 2022-08-15 DIAGNOSIS — E538 Deficiency of other specified B group vitamins: Secondary | ICD-10-CM | POA: Insufficient documentation

## 2022-08-15 DIAGNOSIS — Z79899 Other long term (current) drug therapy: Secondary | ICD-10-CM | POA: Insufficient documentation

## 2022-08-15 DIAGNOSIS — R634 Abnormal weight loss: Secondary | ICD-10-CM | POA: Diagnosis not present

## 2022-08-15 DIAGNOSIS — D649 Anemia, unspecified: Secondary | ICD-10-CM

## 2022-08-15 DIAGNOSIS — K5720 Diverticulitis of large intestine with perforation and abscess without bleeding: Secondary | ICD-10-CM | POA: Insufficient documentation

## 2022-08-15 DIAGNOSIS — Z853 Personal history of malignant neoplasm of breast: Secondary | ICD-10-CM | POA: Diagnosis not present

## 2022-08-15 DIAGNOSIS — K5120 Ulcerative (chronic) proctitis without complications: Secondary | ICD-10-CM | POA: Insufficient documentation

## 2022-08-15 DIAGNOSIS — E039 Hypothyroidism, unspecified: Secondary | ICD-10-CM | POA: Diagnosis not present

## 2022-08-15 DIAGNOSIS — I6529 Occlusion and stenosis of unspecified carotid artery: Secondary | ICD-10-CM | POA: Diagnosis not present

## 2022-08-15 DIAGNOSIS — E782 Mixed hyperlipidemia: Secondary | ICD-10-CM | POA: Diagnosis not present

## 2022-08-15 DIAGNOSIS — Z9221 Personal history of antineoplastic chemotherapy: Secondary | ICD-10-CM | POA: Insufficient documentation

## 2022-08-15 LAB — CBC WITH DIFFERENTIAL (CANCER CENTER ONLY)
Abs Immature Granulocytes: 0.01 10*3/uL (ref 0.00–0.07)
Basophils Absolute: 0 10*3/uL (ref 0.0–0.1)
Basophils Relative: 1 %
Eosinophils Absolute: 0.1 10*3/uL (ref 0.0–0.5)
Eosinophils Relative: 3 %
HCT: 29.1 % — ABNORMAL LOW (ref 36.0–46.0)
Hemoglobin: 9.6 g/dL — ABNORMAL LOW (ref 12.0–15.0)
Immature Granulocytes: 0 %
Lymphocytes Relative: 28 %
Lymphs Abs: 1.1 10*3/uL (ref 0.7–4.0)
MCH: 31.9 pg (ref 26.0–34.0)
MCHC: 33 g/dL (ref 30.0–36.0)
MCV: 96.7 fL (ref 80.0–100.0)
Monocytes Absolute: 0.4 10*3/uL (ref 0.1–1.0)
Monocytes Relative: 11 %
Neutro Abs: 2.2 10*3/uL (ref 1.7–7.7)
Neutrophils Relative %: 57 %
Platelet Count: 183 10*3/uL (ref 150–400)
RBC: 3.01 MIL/uL — ABNORMAL LOW (ref 3.87–5.11)
RDW: 12.4 % (ref 11.5–15.5)
WBC Count: 3.9 10*3/uL — ABNORMAL LOW (ref 4.0–10.5)
nRBC: 0 % (ref 0.0–0.2)

## 2022-08-15 MED FILL — Iron Sucrose Inj 20 MG/ML (Fe Equiv): INTRAVENOUS | Qty: 10 | Status: AC

## 2022-08-15 NOTE — Progress Notes (Signed)
[facility]  [phone] Fax:([phone]  ID: Michelle Johns OB: October 18, 1945  MR#: [id number]  XLK#:[id number]  Patient Care Team: Danella Penton, MD as PCP - General (Internal Medicine) Nadeen Landau, MD (Inactive) as Referring Physician (Surgery)  REFERRING PROVIDER: Dr. Hyacinth Meeker  REASON FOR REFERRAL: Anemia  HPI: Michelle Johns is a 77 y.o. female with past medical history of left breast cancer status postsurgery chemo RT in 2013, hypothyroidism, sigmoid colectomy was referred to hematology for workup of anemia.  Patient developed anemia in November 2023 with hemoglobin of 10.2.  MCV 94.7.  WBC 8.5 and platelets of 291.  Ferritin 341.  Iron panel consistent with anemia of chronic disease.  Weight loss of 25 lbs in 1 month. Had CT abdomen pelvis for diarrhea on 01/05/2022 which showed sigmoid diverticulosis with sigmoid mucosal thickening.  Endoscopic correlation recommended to exclude neoplastic etiology.  Sigmoid mesenteric adenopathy. Had colonoscopy with Dr. Timothy Lasso in December 2023 which showed fungating and ulcerated completely obstructing large mass in the sigmoid colon.  Measured at least 10 cm.  Localized area of granular mucosa found in the rectum.  Upper endoscopy was normal.  Surgical pathology of the sigmoid colon mass showed moderate chronic active colitis with focal ulceration and inflamed granulation tissue.  Negative for granuloma, dysplasia and malignancy.  Rectal biopsy showing mildly active proctitis. Had second opinion surgical path at [facility] which agreed with previous findings.   She has history of sigmoid colectomy with colorectal anastomosis and loop ileostomy in January 2024 at [facility]  Had ileostomy takedown in April 2024.  Surgical Path-02/2022-A: Sigmoid colon, partial colectomy - Segment of colon with extensive ulcer, transmural dense acute and chronic inflammation with microscopic evidence of perforation and diverticular formation -  No definite infectious microorganisms or viral cytopathic effect identified - No evidence of dysplasia or malignancy  - Viable surgical margins with active inflammation - 6 benign lymph nodes   From March 2024 B12 level 315.  From 07/18/2022 ferritin 22, hemoglobin 9.7 CRP level elevated to 18.  She was treated with oral iron for 2 months with no improvement in hemoglobin.  Has history of HER2 positive breast cancer treated in 2013 by Dr. Doylene Canning.  Interval history Patient was seen today accompanied by her husband for follow-up of labs and anemia She reports fatigue.  Otherwise she has been feeling well.  Her appetite is good.  Weight gain of 2 pounds in past week or so.  Denies any shortness of breath or chest pain.  REVIEW OF SYSTEMS:   Review of Systems  Constitutional:  Positive for malaise/fatigue.    As per HPI. Otherwise, a complete review of systems is negative.  PAST MEDICAL HISTORY: Past Medical History:  Diagnosis Date   Breast cancer (HCC) 2013   left breast cancer   Cancer of left breast (HCC)    left 2013 with chemo and radiation therapy   Combined hyperlipidemia    Hypothyroidism    Personal history of chemotherapy 2013   left breast ca   Personal history of radiation therapy 2013   LEFT lumpectomy w/ radiation    PAST SURGICAL HISTORY: Past Surgical History:  Procedure Laterality Date   BREAST EXCISIONAL BIOPSY Left 2013   positive,2013 with chemo and radiation therapy   BREAST LUMPECTOMY Left 2013   w/ radiation and chemo   COLONOSCOPY N/A 01/20/2022   Procedure: COLONOSCOPY;  Surgeon: Jaynie Collins, DO;  Location: [facility] ENDOSCOPY;  Service: Gastroenterology;  Laterality: N/A;  COLONOSCOPY N/A 02/02/2022   Procedure: COLONOSCOPY;  Surgeon: Jaynie Collins, DO;  Location: [facility] ENDOSCOPY;  Service: Gastroenterology;  Laterality: N/A;   COLONOSCOPY WITH PROPOFOL N/A 09/21/2016   Procedure: COLONOSCOPY WITH PROPOFOL;  Surgeon: Christena Deem, MD;  Location: [facility] ENDOSCOPY;  Service: Endoscopy;  Laterality: N/A;   ESOPHAGOGASTRODUODENOSCOPY N/A 01/20/2022   Procedure: ESOPHAGOGASTRODUODENOSCOPY (EGD);  Surgeon: Jaynie Collins, DO;  Location: [facility] ENDOSCOPY;  Service: Gastroenterology;  Laterality: N/A;   TUBAL LIGATION      FAMILY HISTORY: Family History  Problem Relation Age of Onset   Breast cancer Sister 9    HEALTH MAINTENANCE: Social History   Tobacco Use   Smoking status: Never   Smokeless tobacco: Never  Vaping Use   Vaping Use: Never used  Substance Use Topics   Alcohol use: No    Alcohol/week: 0.0 standard drinks of alcohol   Drug use: No     Allergies  Allergen Reactions   Other Other (See Comments)    Pt took herceptin, taxotere, and carboplatin 06/2011 and was in hospital for several days with fever 104. Dr. Doylene Canning at the time did not know what chemo she had reaction to but was started on another treatment thereafter.    Current Outpatient Medications  Medication Sig Dispense Refill   acetaminophen (TYLENOL) 500 MG tablet Take by mouth.     Cholecalciferol (VITAMIN D-1000 MAX ST) 1000 UNITS tablet Take 1,000 Units by mouth daily.      CRESTOR 10 MG tablet Take 10 mg by mouth daily.      Cyanocobalamin (VITAMIN B-12) 5000 MCG LOZG Take 2,500 mcg by mouth once a week.     levothyroxine (SYNTHROID) 88 MCG tablet Take by mouth.     No current facility-administered medications for this visit.    OBJECTIVE: Vitals:   08/15/22 1426  Pulse: 61  Resp: 18  SpO2: 99%     Body mass index is 21.58 kg/m.      General: Well-developed, well-nourished, no acute distress. Eyes: Pink conjunctiva, anicteric sclera. HEENT: Normocephalic, moist mucous membranes, clear oropharnyx. Lungs: Clear to auscultation bilaterally. Heart: Regular rate and rhythm. No rubs, murmurs, or gallops. Abdomen: Soft, nontender, nondistended. No organomegaly noted, normoactive bowel sounds. Musculoskeletal: No  edema, cyanosis, or clubbing. Neuro: Alert, answering all questions appropriately. Cranial nerves grossly intact. Skin: No rashes or petechiae noted. Psych: Normal affect. Lymphatics: No cervical, calvicular, axillary or inguinal LAD.   LAB RESULTS:  Lab Results  Component Value Date   NA 139 08/18/2015   K 4.3 08/18/2015   CL 107 08/18/2015   CO2 28 08/18/2015   GLUCOSE 103 (H) 08/18/2015   BUN 16 08/18/2015   CREATININE 0.72 08/18/2015   CALCIUM 9.1 08/18/2015   PROT 6.8 08/18/2015   ALBUMIN 4.1 08/18/2015   AST 22 08/18/2015   ALT 18 08/18/2015   ALKPHOS 75 08/18/2015   BILITOT 0.9 08/18/2015   GFRNONAA >60 08/18/2015   GFRAA >60 08/18/2015    Lab Results  Component Value Date   WBC 3.9 (L) 08/15/2022   NEUTROABS 2.2 08/15/2022   HGB 9.6 (L) 08/15/2022   HCT 29.1 (L) 08/15/2022   MCV 96.7 08/15/2022   PLT 183 08/15/2022    Lab Results  Component Value Date   TIBC 382 08/01/2022   TIBC 249 (L) 07/06/2011   FERRITIN 24 08/01/2022   FERRITIN 1,035 (H) 07/06/2011   IRONPCTSAT 39 (H) 08/01/2022   IRONPCTSAT 38 07/06/2011     STUDIES: CT  HEAD WO CONTRAST ( )  Result Date: 07/20/2022 CLINICAL DATA:  Pain and tingling in the left neck and left arm. History of breast cancer. EXAM: CT HEAD WITHOUT CONTRAST TECHNIQUE: Contiguous axial images were obtained from the base of the skull through the vertex without intravenous contrast. RADIATION DOSE REDUCTION: This exam was performed according to the departmental dose-optimization program which includes automated exposure control, adjustment of the mA and/or kV according to patient size and/or use of iterative reconstruction technique. COMPARISON:  None Available. FINDINGS: Brain: There is mild cerebral atrophy with mild-to-moderate small vessel disease of the cerebral white matter with atrophic ventriculomegaly. No focal asymmetry is seen concerning for an acute cortical based infarct, hemorrhage, mass or mass effect. There  is no midline shift. The basal cisterns are clear. The brainstem and cerebellum are unremarkable. Vascular: There are scattered calcifications in the carotid siphons. No hyperdense central vessel is seen. Skull: Negative for fractures or focal lesions. Sinuses/Orbits: Negative visualized upper to mid orbits. Clear visualized paranasal sinuses. There is mild fluid in both mastoid tips. Rest of the mastoid air cells are clear. Other: None. IMPRESSION: 1. No acute intracranial CT findings.  Chronic changes. 2. Carotid atherosclerosis. 3. Mild fluid in both mastoid tips. Electronically Signed   By: Almira Bar M.D.   On: 07/20/2022 00:22    ASSESSMENT AND PLAN:   Michelle Johns is a 77 y.o. female with pmh of  left breast cancer status postsurgery chemo RT in 2013, hypothyroidism, sigmoid colectomy was referred to hematology for workup of anemia.  # Normocytic anemia -Of unknown etiology. Started in November 2023 ranging between 8-9.   -Initially iron panel was consistent with anemia of chronic disease.  Repeat iron panel is normal.  ESR normal.  CRP mildly elevated at 1.2 and is significantly improved from 18 in April 2024.  B12 394.  On B12 supplements.  Folate normal.  Myeloma panel negative.  No kidney dysfunction.   -I discussed with the patient in her husband about the lab results which has been normal and there is no clear explanation for her anemia.  I would hold off on iron infusions since her iron panel has been normal.  WBC is 3.9 which is borderline but in the past has always been okay.  Platelets are normal. Retic low for the degree of anemia.  Considering this is isolated anemia which has been stable for past 6 to 7 months, I consider monitoring it closely.  If it continues to trend down, would consider bone marrow biopsy.  I will follow-up with her in 2 months.  At that time I will also check vitamin B1, B6, copper level,  UPEP with IFE.  Will also recheck iron levels.  Suspicion for hemolysis  is low since total bili is normal.  But since there is no obvious cause I will check LDH and haptoglobin also.  # History of sigmoid colectomy with colorectal anastomosis -In January 2024 at [facility]. - Had colonoscopy with Dr. Timothy Lasso in December 2023 which showed fungating and ulcerated completely obstructing large mass in the sigmoid colon.  Measured at least 10 cm.  Localized area of granular mucosa found in the rectum.  Upper endoscopy was normal.  Surgical pathology of the sigmoid colon mass showed moderate chronic active colitis with focal ulceration and inflamed granulation tissue.  Negative for granuloma, dysplasia and malignancy.  Rectal biopsy showing mildly active proctitis. Had second opinion surgical path at [facility] which agreed with previous findings.   She has  history of sigmoid colectomy with colorectal anastomosis and loop ileostomy in January 2024 at [facility].  Had ileostomy takedown in April 2024.  Surgical Path-02/2022-A: Sigmoid colon, partial colectomy - Segment of colon with extensive ulcer, transmural dense acute and chronic inflammation with microscopic evidence of perforation and diverticular formation - No definite infectious microorganisms or viral cytopathic effect identified - No evidence of dysplasia or malignancy  - Viable surgical margins with active inflammation - 6 benign lymph nodes   -Scheduled for repeat colonoscopy in September 2024.  # History of left breast cancer status post surgery chemo RT in 2013 -Follows with primary.  Last mammo in May 2024 was normal.  Repeat in 1 year.  # History of B12 deficiency -Managed by Dr. Hyacinth Meeker.  On B12 2500 mcg twice a week  Orders Placed This Encounter  Procedures   CBC with Differential (Cancer Center Only)   Ferritin   Iron and TIBC([facility])   CMP (Cancer Center only)   Vitamin B1   Vitamin B6   Copper, serum   Immunofixation, urine   Lactate dehydrogenase   Haptoglobin   RTC in 2 months for MD visit,  labs  Patient expressed understanding and was in agreement with this plan. She also understands that She can call clinic at any time with any questions, concerns, or complaints.   I spent a total of 30 minutes reviewing chart data, face-to-face evaluation with the patient, counseling and coordination of care as detailed above.  Michaelyn Barter, MD   08/15/2022 4:12 PM

## 2022-08-22 ENCOUNTER — Ambulatory Visit: Payer: Medicare PPO

## 2022-08-29 ENCOUNTER — Ambulatory Visit: Payer: Medicare PPO

## 2022-09-05 ENCOUNTER — Ambulatory Visit: Payer: Medicare PPO

## 2022-10-16 ENCOUNTER — Inpatient Hospital Stay: Payer: Medicare PPO

## 2022-10-16 ENCOUNTER — Inpatient Hospital Stay: Payer: Medicare PPO | Admitting: Internal Medicine

## 2022-10-24 ENCOUNTER — Inpatient Hospital Stay: Payer: Medicare PPO | Admitting: Internal Medicine

## 2022-10-24 ENCOUNTER — Inpatient Hospital Stay: Payer: Medicare PPO | Attending: Internal Medicine

## 2022-10-24 VITALS — BP 120/60 | HR 59 | Temp 98.4°F | Resp 20 | Wt 130.4 lb

## 2022-10-24 DIAGNOSIS — K5720 Diverticulitis of large intestine with perforation and abscess without bleeding: Secondary | ICD-10-CM | POA: Insufficient documentation

## 2022-10-24 DIAGNOSIS — K5120 Ulcerative (chronic) proctitis without complications: Secondary | ICD-10-CM | POA: Diagnosis not present

## 2022-10-24 DIAGNOSIS — E782 Mixed hyperlipidemia: Secondary | ICD-10-CM | POA: Diagnosis not present

## 2022-10-24 DIAGNOSIS — R634 Abnormal weight loss: Secondary | ICD-10-CM | POA: Diagnosis not present

## 2022-10-24 DIAGNOSIS — Z79899 Other long term (current) drug therapy: Secondary | ICD-10-CM | POA: Insufficient documentation

## 2022-10-24 DIAGNOSIS — Z9221 Personal history of antineoplastic chemotherapy: Secondary | ICD-10-CM | POA: Diagnosis not present

## 2022-10-24 DIAGNOSIS — Z923 Personal history of irradiation: Secondary | ICD-10-CM | POA: Diagnosis not present

## 2022-10-24 DIAGNOSIS — Z853 Personal history of malignant neoplasm of breast: Secondary | ICD-10-CM | POA: Diagnosis not present

## 2022-10-24 DIAGNOSIS — E039 Hypothyroidism, unspecified: Secondary | ICD-10-CM | POA: Diagnosis not present

## 2022-10-24 DIAGNOSIS — D649 Anemia, unspecified: Secondary | ICD-10-CM

## 2022-10-24 LAB — IRON AND TIBC
Iron: 97 ug/dL (ref 28–170)
Saturation Ratios: 28 % (ref 10.4–31.8)
TIBC: 351 ug/dL (ref 250–450)
UIBC: 254 ug/dL

## 2022-10-24 LAB — CBC WITH DIFFERENTIAL (CANCER CENTER ONLY)
Abs Immature Granulocytes: 0.01 10*3/uL (ref 0.00–0.07)
Basophils Absolute: 0 10*3/uL (ref 0.0–0.1)
Basophils Relative: 1 %
Eosinophils Absolute: 0.1 10*3/uL (ref 0.0–0.5)
Eosinophils Relative: 2 %
HCT: 33 % — ABNORMAL LOW (ref 36.0–46.0)
Hemoglobin: 10.9 g/dL — ABNORMAL LOW (ref 12.0–15.0)
Immature Granulocytes: 0 %
Lymphocytes Relative: 24 %
Lymphs Abs: 0.9 10*3/uL (ref 0.7–4.0)
MCH: 31.2 pg (ref 26.0–34.0)
MCHC: 33 g/dL (ref 30.0–36.0)
MCV: 94.6 fL (ref 80.0–100.0)
Monocytes Absolute: 0.4 10*3/uL (ref 0.1–1.0)
Monocytes Relative: 10 %
Neutro Abs: 2.5 10*3/uL (ref 1.7–7.7)
Neutrophils Relative %: 63 %
Platelet Count: 164 10*3/uL (ref 150–400)
RBC: 3.49 MIL/uL — ABNORMAL LOW (ref 3.87–5.11)
RDW: 11.9 % (ref 11.5–15.5)
WBC Count: 3.9 10*3/uL — ABNORMAL LOW (ref 4.0–10.5)
nRBC: 0 % (ref 0.0–0.2)

## 2022-10-24 LAB — CMP (CANCER CENTER ONLY)
ALT: 22 U/L (ref 0–44)
AST: 31 U/L (ref 15–41)
Albumin: 3.9 g/dL (ref 3.5–5.0)
Alkaline Phosphatase: 55 U/L (ref 38–126)
Anion gap: 4 — ABNORMAL LOW (ref 5–15)
BUN: 23 mg/dL (ref 8–23)
CO2: 28 mmol/L (ref 22–32)
Calcium: 9.3 mg/dL (ref 8.9–10.3)
Chloride: 107 mmol/L (ref 98–111)
Creatinine: 0.96 mg/dL (ref 0.44–1.00)
GFR, Estimated: >60 mL/min (ref >60)
Glucose, Bld: 79 mg/dL (ref 70–99)
Potassium: 3.9 mmol/L (ref 3.5–5.1)
Sodium: 139 mmol/L (ref 135–145)
Total Bilirubin: 0.7 mg/dL (ref 0.3–1.2)
Total Protein: 6.5 g/dL (ref 6.5–8.1)

## 2022-10-24 LAB — LACTATE DEHYDROGENASE: LDH: 142 U/L (ref 98–192)

## 2022-10-24 LAB — FERRITIN: Ferritin: 28 ng/mL (ref 11–307)

## 2022-10-24 LAB — VITAMIN B12: Vitamin B-12: 546 pg/mL (ref 180–914)

## 2022-10-24 NOTE — Progress Notes (Signed)
[facility]  Telephone:(336) [phone] Fax:([phone]  ID: Barbera Setters OB: 1945/11/26  MR#: [id number]  AOZ#:[id number]  Patient Care Team: Danella Penton, MD as PCP - General (Internal Medicine) Nadeen Landau, MD (Inactive) as Referring Physician (Surgery) Michaelyn Barter, MD as Consulting Physician (Oncology)  REFERRING PROVIDER: Dr. Hyacinth Meeker  REASON FOR REFERRAL: Anemia  HPI: Michelle Johns is a 77 y.o. female with past medical history of left breast cancer status postsurgery chemo RT in 2013, hypothyroidism, sigmoid colectomy was referred to hematology for workup of anemia.  Patient developed anemia in November 2023 with hemoglobin of 10.2.  MCV 94.7.  WBC 8.5 and platelets of 291.  Ferritin 341.  Iron panel consistent with anemia of chronic disease.  Weight loss of 25 lbs in 1 month. Had CT abdomen pelvis for diarrhea on 01/05/2022 which showed sigmoid diverticulosis with sigmoid mucosal thickening.  Endoscopic correlation recommended to exclude neoplastic etiology.  Sigmoid mesenteric adenopathy. Had colonoscopy with Dr. Timothy Lasso in December 2023 which showed fungating and ulcerated completely obstructing large mass in the sigmoid colon.  Measured at least 10 cm.  Localized area of granular mucosa found in the rectum.  Upper endoscopy was normal.  Surgical pathology of the sigmoid colon mass showed moderate chronic active colitis with focal ulceration and inflamed granulation tissue.  Negative for granuloma, dysplasia and malignancy.  Rectal biopsy showing mildly active proctitis. Had second opinion surgical path at [facility] which agreed with previous findings.   She has history of sigmoid colectomy with colorectal anastomosis and loop ileostomy in January 2024 at [facility]  Had ileostomy takedown in April 2024.  Surgical Path-02/2022-A: Sigmoid colon, partial colectomy - Segment of colon with extensive ulcer, transmural dense acute and chronic inflammation with microscopic  evidence of perforation and diverticular formation - No definite infectious microorganisms or viral cytopathic effect identified - No evidence of dysplasia or malignancy  - Viable surgical margins with active inflammation - 6 benign lymph nodes   From March 2024 B12 level 315.  From 07/18/2022 ferritin 22, hemoglobin 9.7 CRP level elevated to 18.  She was treated with oral iron for 2 months with no improvement in hemoglobin.  Has history of HER2 positive breast cancer treated in 2013 by Dr. Doylene Canning.  Interval history Patient was seen today accompanied by her husband for follow-up of labs and anemia Has been feeling well overall.  Denies any new concerns.  REVIEW OF SYSTEMS:   Review of Systems  Constitutional:  Positive for malaise/fatigue.    As per HPI. Otherwise, a complete review of systems is negative.  PAST MEDICAL HISTORY: Past Medical History:  Diagnosis Date   Breast cancer (HCC) 2013   left breast cancer   Cancer of left breast (HCC)    left 2013 with chemo and radiation therapy   Combined hyperlipidemia    Hypothyroidism    Personal history of chemotherapy 2013   left breast ca   Personal history of radiation therapy 2013   LEFT lumpectomy w/ radiation    PAST SURGICAL HISTORY: Past Surgical History:  Procedure Laterality Date   BREAST EXCISIONAL BIOPSY Left 2013   positive,2013 with chemo and radiation therapy   BREAST LUMPECTOMY Left 2013   w/ radiation and chemo   COLONOSCOPY N/A 01/20/2022   Procedure: COLONOSCOPY;  Surgeon: Jaynie Collins, DO;  Location: [facility] ENDOSCOPY;  Service: Gastroenterology;  Laterality: N/A;   COLONOSCOPY N/A 02/02/2022   Procedure: COLONOSCOPY;  Surgeon: Jaynie Collins, DO;  Location: [facility]  ENDOSCOPY;  Service: Gastroenterology;  Laterality: N/A;   COLONOSCOPY WITH PROPOFOL N/A 09/21/2016   Procedure: COLONOSCOPY WITH PROPOFOL;  Surgeon: Christena Deem, MD;  Location: [facility] ENDOSCOPY;  Service: Endoscopy;   Laterality: N/A;   ESOPHAGOGASTRODUODENOSCOPY N/A 01/20/2022   Procedure: ESOPHAGOGASTRODUODENOSCOPY (EGD);  Surgeon: Jaynie Collins, DO;  Location: [facility] ENDOSCOPY;  Service: Gastroenterology;  Laterality: N/A;   TUBAL LIGATION      FAMILY HISTORY: Family History  Problem Relation Age of Onset   Breast cancer Sister 43    HEALTH MAINTENANCE: Social History   Tobacco Use   Smoking status: Never   Smokeless tobacco: Never  Vaping Use   Vaping status: Never Used  Substance Use Topics   Alcohol use: No    Alcohol/week: 0.0 standard drinks of alcohol   Drug use: No     Allergies  Allergen Reactions   Other Other (See Comments)    Pt took herceptin, taxotere, and carboplatin 06/2011 and was in hospital for several days with fever 104. Dr. Doylene Canning at the time did not know what chemo she had reaction to but was started on another treatment thereafter.    Current Outpatient Medications  Medication Sig Dispense Refill   acetaminophen (TYLENOL) 500 MG tablet Take by mouth.     Cholecalciferol (VITAMIN D-1000 MAX ST) 1000 UNITS tablet Take 1,000 Units by mouth daily.      CRESTOR 10 MG tablet Take 10 mg by mouth daily.      Cyanocobalamin (VITAMIN B-12) 5000 MCG LOZG Take 2,500 mcg by mouth once a week.     levothyroxine (SYNTHROID) 88 MCG tablet Take by mouth.     No current facility-administered medications for this visit.    OBJECTIVE: Vitals:   10/24/22 0955  BP: 120/60  Pulse: (!) 59  Resp: 20  Temp: 98.4 F (36.9 C)     Body mass index is 21.7 kg/m.      General: Well-developed, well-nourished, no acute distress. Eyes: Pink conjunctiva, anicteric sclera. HEENT: Normocephalic, moist mucous membranes, clear oropharnyx. Lungs: Clear to auscultation bilaterally. Heart: Regular rate and rhythm. No rubs, murmurs, or gallops. Abdomen: Soft, nontender, nondistended. No organomegaly noted, normoactive bowel sounds. Musculoskeletal: No edema, cyanosis, or  clubbing. Neuro: Alert, answering all questions appropriately. Cranial nerves grossly intact. Skin: No rashes or petechiae noted. Psych: Normal affect. Lymphatics: No cervical, calvicular, axillary or inguinal LAD.   LAB RESULTS:  Lab Results  Component Value Date   NA 139 10/24/2022   K 3.9 10/24/2022   CL 107 10/24/2022   CO2 28 10/24/2022   GLUCOSE 79 10/24/2022   BUN 23 10/24/2022   CREATININE 0.96 10/24/2022   CALCIUM 9.3 10/24/2022   PROT 6.5 10/24/2022   ALBUMIN 3.9 10/24/2022   AST 31 10/24/2022   ALT 22 10/24/2022   ALKPHOS 55 10/24/2022   BILITOT 0.7 10/24/2022   GFRNONAA >60 10/24/2022   GFRAA >60 08/18/2015    Lab Results  Component Value Date   WBC 3.9 (L) 10/24/2022   NEUTROABS 2.5 10/24/2022   HGB 10.9 (L) 10/24/2022   HCT 33.0 (L) 10/24/2022   MCV 94.6 10/24/2022   PLT 164 10/24/2022    Lab Results  Component Value Date   TIBC 351 10/24/2022   TIBC 382 08/01/2022   TIBC 249 (L) 07/06/2011   FERRITIN 28 10/24/2022   FERRITIN 24 08/01/2022   FERRITIN 1,035 (H) 07/06/2011   IRONPCTSAT 28 10/24/2022   IRONPCTSAT 39 (H) 08/01/2022   IRONPCTSAT 38 07/06/2011  STUDIES: No results found.  ASSESSMENT AND PLAN:   Michelle Johns is a 77 y.o. female with pmh of  left breast cancer status postsurgery chemo RT in 2013, hypothyroidism, sigmoid colectomy was referred to hematology for workup of anemia.  # Normocytic anemia -Of unknown etiology. Started in November 2023 ranging between 9-10  -Initially iron panel was consistent with anemia of chronic disease.  Repeat iron panel is normal.  ESR normal.  CRP mildly elevated at 1.2 and is significantly improved from 18 in April 2024.  B12 394.  On B12 supplements.  Folate normal.  Myeloma panel negative.  No kidney dysfunction.   -LDH normal.  Haptoglobin, vitamin B1/B6/copper pending.  Urine immunofixation pending.  Repeat iron level is normal.  These findings were discussed with the patient and her  husband.  Her hemoglobin remained stable at 10.9 today. Hb stable between 9-10 for close to a year now.  We did discuss about bone marrow biopsy previously but would hold off until her anemia worsens.  She will have lab appointment only in December.  She will see Dr. Hyacinth Meeker in March and then follow-up with me in June 2025.  Patient knows that if she notices any new symptoms or changes with her health to reach out to [place].  # History of sigmoid colectomy with colorectal anastomosis -In January 2024 at [facility]. - Had colonoscopy with Dr. Timothy Lasso in December 2023 which showed fungating and ulcerated completely obstructing large mass in the sigmoid colon.  Measured at least 10 cm.  Localized area of granular mucosa found in the rectum.  Upper endoscopy was normal.  Surgical pathology of the sigmoid colon mass showed moderate chronic active colitis with focal ulceration and inflamed granulation tissue.  Negative for granuloma, dysplasia and malignancy.  Rectal biopsy showing mildly active proctitis. Had second opinion surgical path at [facility] which agreed with previous findings.   She has history of sigmoid colectomy with colorectal anastomosis and loop ileostomy in January 2024 at [facility].  Had ileostomy takedown in April 2024.  Surgical Path-02/2022-A: Sigmoid colon, partial colectomy - Segment of colon with extensive ulcer, transmural dense acute and chronic inflammation with microscopic evidence of perforation and diverticular formation - No definite infectious microorganisms or viral cytopathic effect identified - No evidence of dysplasia or malignancy  - Viable surgical margins with active inflammation - 6 benign lymph nodes   -Scheduled for repeat colonoscopy in September 2024.  # History of left breast cancer status post surgery chemo RT in 2013 -Follows with primary.  Last mammo in May 2024 was normal.  Repeat in 1 year.  # History of B12 deficiency -Managed by Dr. Hyacinth Meeker.  On B12 2500 mcg twice a  week  Orders Placed This Encounter  Procedures   Iron and TIBC   CBC with Differential (Cancer Center Only)   Ferritin   CBC with Differential/Platelet   Vitamin B12   Iron and TIBC   Ferritin   RTC in 3 months for labs only  RTC in June for md visit, labs She will see Dr. Hyacinth Meeker in between  Patient expressed understanding and was in agreement with this plan. She also understands that She can call clinic at any time with any questions, concerns, or complaints.   I spent a total of 30 minutes reviewing chart data, face-to-face evaluation with the patient, counseling and coordination of care as detailed above.  Michaelyn Barter, MD   10/24/2022 12:29 PM

## 2022-10-26 LAB — COPPER, SERUM: Copper: 105 ug/dL (ref 80–158)

## 2022-10-27 LAB — VITAMIN B1: Vitamin B1 (Thiamine): 162.4 nmol/L (ref 66.5–200.0)

## 2022-10-30 ENCOUNTER — Encounter: Payer: Self-pay | Admitting: Gastroenterology

## 2022-10-30 LAB — VITAMIN B6: Vitamin B6: 36.2 ug/L (ref 3.4–65.2)

## 2022-11-03 ENCOUNTER — Encounter: Payer: Self-pay | Admitting: Gastroenterology

## 2022-11-06 ENCOUNTER — Ambulatory Visit: Payer: Medicare PPO | Admitting: Certified Registered"

## 2022-11-06 ENCOUNTER — Ambulatory Visit
Admission: RE | Admit: 2022-11-06 | Discharge: 2022-11-06 | Disposition: A | Discharge status: Home / Self Care | Payer: Medicare PPO | Attending: Gastroenterology | Admitting: Gastroenterology

## 2022-11-06 ENCOUNTER — Encounter
Admission: RE | Disposition: A | Discharge status: Home / Self Care | Payer: Self-pay | Source: Home / Self Care | Attending: Gastroenterology

## 2022-11-06 ENCOUNTER — Encounter: Payer: Self-pay | Admitting: Gastroenterology

## 2022-11-06 DIAGNOSIS — K635 Polyp of colon: Secondary | ICD-10-CM | POA: Diagnosis not present

## 2022-11-06 DIAGNOSIS — K5730 Diverticulosis of large intestine without perforation or abscess without bleeding: Secondary | ICD-10-CM | POA: Diagnosis not present

## 2022-11-06 DIAGNOSIS — Z09 Encounter for follow-up examination after completed treatment for conditions other than malignant neoplasm: Secondary | ICD-10-CM | POA: Insufficient documentation

## 2022-11-06 DIAGNOSIS — Z980 Intestinal bypass and anastomosis status: Secondary | ICD-10-CM | POA: Diagnosis not present

## 2022-11-06 HISTORY — PX: POLYPECTOMY: SHX5525

## 2022-11-06 HISTORY — PX: SUBMUCOSAL LIFTING INJECTION: SHX6855

## 2022-11-06 HISTORY — PX: COLONOSCOPY WITH PROPOFOL: SHX5780

## 2022-11-06 SURGERY — COLONOSCOPY WITH PROPOFOL
Anesthesia: General

## 2022-11-06 MED ORDER — SODIUM CHLORIDE 0.9 % IV SOLN
INTRAVENOUS | Status: DC
  Administered 2022-11-06: 20 mL/h via INTRAVENOUS

## 2022-11-06 MED ORDER — PROPOFOL 10 MG/ML IV BOLUS
INTRAVENOUS | Status: DC | PRN
  Administered 2022-11-06: 10 mg via INTRAVENOUS
  Administered 2022-11-06: 50 mg via INTRAVENOUS
  Administered 2022-11-06: 10 mg via INTRAVENOUS

## 2022-11-06 MED ORDER — NORMAL SALINE NICU FLUSH
INTRAVENOUS | Status: DC | PRN
  Administered 2022-11-06: 5 mL via INTRAVENOUS

## 2022-11-06 MED ORDER — PROPOFOL 500 MG/50ML IV EMUL
INTRAVENOUS | Status: DC | PRN
  Administered 2022-11-06: 150 ug/kg/min via INTRAVENOUS

## 2022-11-06 MED ORDER — LIDOCAINE HCL (CARDIAC) PF 100 MG/5ML IV SOSY
PREFILLED_SYRINGE | INTRAVENOUS | Status: DC | PRN
  Administered 2022-11-06: 50 mg via INTRAVENOUS

## 2022-11-06 MED ORDER — PROPOFOL 1000 MG/100ML IV EMUL
INTRAVENOUS | Status: AC
  Filled 2022-11-06: qty 100

## 2022-11-06 NOTE — Op Note (Signed)
[facility] Gastroenterology Patient Name: Michelle Johns Procedure Date: 11/06/2022 8:59 AM MRN: [id number] Account #: [id number] Date of Birth: 21-Jan-1946 Admit Type: Outpatient Age: 77 Room: [facility] ENDO ROOM 2 Gender: Female Note Status: Finalized Instrument Name: Peds Colonoscope [id number] Procedure:             Colonoscopy Indications:           Follow-up of diverticulosis of the colon Providers:             Jaynie Collins DO, DO Referring MD:          Danella Penton, MD (Referring MD) Medicines:             Monitored Anesthesia Care Complications:         No immediate complications. Estimated blood loss:                         Minimal. Procedure:             Pre-Anesthesia Assessment:                        - Prior to the procedure, a History and Physical was                         performed, and patient medications and allergies were                         reviewed. The patient is competent. The risks and                         benefits of the procedure and the sedation options and                         risks were discussed with the patient. All questions                         were answered and informed consent was obtained.                         Patient identification and proposed procedure were                         verified by the physician, the nurse, the anesthetist                         and the technician in the endoscopy suite. Mental                         Status Examination: alert and oriented. Airway                         Examination: normal oropharyngeal airway and neck                         mobility. Respiratory Examination: clear to                         auscultation. CV Examination: RRR, no murmurs, no S3  or S4. Prophylactic Antibiotics: The patient does not                         require prophylactic antibiotics. Prior                         Anticoagulants: The patient has taken no anticoagulant                          or antiplatelet agents. ASA Grade Assessment: II - A                         patient with mild systemic disease. After reviewing                         the risks and benefits, the patient was deemed in                         satisfactory condition to undergo the procedure. The                         anesthesia plan was to use monitored anesthesia care                         (MAC). Immediately prior to administration of                         medications, the patient was re-assessed for adequacy                         to receive sedatives. The heart rate, respiratory                         rate, oxygen saturations, blood pressure, adequacy of                         pulmonary ventilation, and response to care were                         monitored throughout the procedure. The physical                         status of the patient was re-assessed after the                         procedure.                        After obtaining informed consent, the colonoscope was                         passed under direct vision. Throughout the procedure,                         the patient's blood pressure, pulse, and oxygen                         saturations were monitored continuously. The  Colonoscope was introduced through the anus and                         advanced to the the cecum, identified by appendiceal                         orifice and ileocecal valve. The colonoscopy was                         performed without difficulty. The patient tolerated                         the procedure well. The quality of the bowel                         preparation was evaluated using the BBPS [facility] Bowel                         Preparation Scale) with scores of: Right Colon = 2                         (minor amount of residual staining, small fragments of                         stool and/or opaque liquid, but mucosa seen well),                          Transverse Colon = 3 (entire mucosa seen well with no                         residual staining, small fragments of stool or opaque                         liquid) and Left Colon = 3 (entire mucosa seen well                         with no residual staining, small fragments of stool or                         opaque liquid). The total BBPS score equals 8. The                         quality of the bowel preparation was excellent. The                         ileocecal valve, appendiceal orifice, and rectum were                         photographed. Findings:      The digital rectal exam findings include narrowed anus. Previous       hemorrhoidectomy?Marland Kitchen      A 1 to 2 mm polyp was found in the cecum. The polyp was sessile. The       polyp was removed with a jumbo cold forceps. Resection and retrieval       were complete. Estimated blood loss was minimal.      Multiple small-mouthed diverticula were found in  the entire colon.       Estimated blood loss: none.      There was evidence of a prior functional end-to-end colo-colonic       anastomosis at 18 cm proximal to the anus. This was patent and was       characterized by healthy appearing mucosa and an intact staple line. The       anastomosis was traversed. Estimated blood loss: none.      A 12 to 13 mm polyp was found at 16 cm proximal to the anus. The polyp       was sessile. Polypectomy was attempted, initially using a saline       injection-lift technique with a hot snare. Polyp resection was       incomplete with this device. This intervention then required a different       device and polypectomy technique. The polyp was removed with a jumbo       cold forceps. Resection and retrieval were complete. Imaging was       performed using white light and narrow band imaging to visualize the       mucosa. EMR was performed after saline injection and visualizing under       NBI. Hot snare was used to removed the bulk of the polyp with  cold jumbo       forceps used to remove the remaining polyp. STSC was applied to edges.       Estimated blood loss was minimal.      Scar tissue noted at the Anal canal as seen on previous coloscopy      The exam was otherwise without abnormality on direct and retroflexion       views.      Retroflexion in the right colon was performed. Impression:            - Narrowed anus. Previous hemorrhoidectomy? found on                         digital rectal exam.                        - One 1 to 2 mm polyp in the cecum, removed with a                         jumbo cold forceps. Resected and retrieved.                        - Diverticulosis in the entire examined colon.                        - Patent functional end-to-end colo-colonic                         anastomosis, characterized by healthy appearing mucosa                         and an intact staple line.                        - One 12 to 13 mm polyp at 16 cm proximal to the anus,  removed with a jumbo cold forceps. Resected and                         retrieved.                        - The examination was otherwise normal on direct and                         retroflexion views. Recommendation:        - Patient has a contact number available for                         emergencies. The signs and symptoms of potential                         delayed complications were discussed with the patient.                         Return to normal activities tomorrow. Written                         discharge instructions were provided to the patient.                        - Discharge patient to home.                        - Resume previous diet.                        - Continue present medications.                        - No ibuprofen, naproxen, or other non-steroidal                         anti-inflammatory drugs for 5 days after polyp removal.                        - Await pathology results.                         - Repeat colonoscopy for surveillance based on                         pathology results.                        - Return to GI office as previously scheduled.                        - The findings and recommendations were discussed with                         the patient. Procedure Code(s):     --- Professional ---                        [phone], Colonoscopy, flexible; with biopsy, single or  multiple                        [id number], Colonoscopy, flexible; with directed submucosal                         injection(s), any substance Diagnosis Code(s):     --- Professional ---                        K57.30, Diverticulosis of large intestine without                         perforation or abscess without bleeding                        D12.0, Benign neoplasm of cecum                        Z98.0, Intestinal bypass and anastomosis status CPT copyright 2022 American Medical Association. All rights reserved. The codes documented in this report are preliminary and upon coder review may  be revised to meet current compliance requirements. Attending Participation:      I personally performed the entire procedure. Elfredia Nevins, DO Jaynie Collins DO, DO 11/06/2022 10:15:01 AM This report has been signed electronically. Number of Addenda: 0 Note Initiated On: 11/06/2022 8:59 AM Scope Withdrawal Time: 0 hours 39 minutes 32 seconds  Total Procedure Duration: 0 hours 44 minutes 57 seconds  Estimated Blood Loss:  Estimated blood loss was minimal.      [facility]

## 2022-11-06 NOTE — Anesthesia Procedure Notes (Signed)
Procedure Name: MAC Date/Time: 11/06/2022 9:13 AM  Performed by: Cheral Bay, CRNAPre-anesthesia Checklist: Patient identified, Emergency Drugs available, Suction available, Patient being monitored and Timeout performed Patient Re-evaluated:Patient Re-evaluated prior to induction Oxygen Delivery Method: Nasal cannula Induction Type: IV induction Placement Confirmation: positive ETCO2 and CO2 detector

## 2022-11-06 NOTE — Transfer of Care (Signed)
Immediate Anesthesia Transfer of Care Note  Patient: Michelle Johns  Procedure(s) Performed: COLONOSCOPY WITH PROPOFOL POLYPECTOMY  Patient Location: PACU and Endoscopy Unit  Anesthesia Type:General  Level of Consciousness: sedated  Airway & Oxygen Therapy: Patient Spontanous Breathing  Post-op Assessment: Report given to RN and Post -op Vital signs reviewed and stable  Post vital signs: Reviewed  Last Vitals:  Vitals Value Taken Time  BP 106/52 11/06/22 1009  Temp 36.3 C 11/06/22 1007  Pulse 53 11/06/22 1010  Resp 12 11/06/22 1010  SpO2 100 % 11/06/22 1010  Vitals shown include unfiled device data.  Last Pain:  Vitals:   11/06/22 1007  TempSrc: Temporal  PainSc: Asleep         Complications: No notable events documented.

## 2022-11-06 NOTE — Anesthesia Preprocedure Evaluation (Signed)
Anesthesia Evaluation  Patient identified by MRN, date of birth, ID band Patient awake    Reviewed: Allergy & Precautions, NPO status , Patient's Chart, lab work & pertinent test results  History of Anesthesia Complications Negative for: history of anesthetic complications  Airway Mallampati: II  TM Distance: >3 FB Neck ROM: Full    Dental no notable dental hx. (+) Teeth Intact   Pulmonary neg pulmonary ROS, neg sleep apnea, neg COPD, Patient abstained from smoking.Not current smoker   Pulmonary exam normal breath sounds clear to auscultation       Cardiovascular Exercise Tolerance: Good METS(-) hypertension(-) CAD and (-) Past MI negative cardio ROS (-) dysrhythmias  Rhythm:Regular Rate:Normal - Systolic murmurs    Neuro/Psych negative neurological ROS  negative psych ROS   GI/Hepatic ,neg GERD  ,,(+)     (-) substance abuse    Endo/Other  neg diabetesHypothyroidism    Renal/GU negative Renal ROS     Musculoskeletal   Abdominal   Peds  Hematology   Anesthesia Other Findings Past Medical History: 2013: Breast cancer (HCC)     Comment:  left breast cancer No date: Cancer of left breast (HCC)     Comment:  left 2013 with chemo and radiation therapy 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 No date: Hypothyroidism 2013: Personal history of chemotherapy     Comment:  left breast ca 2013: Personal history of radiation therapy     Comment:  LEFT lumpectomy w/ radiation  Reproductive/Obstetrics                             Anesthesia Physical Anesthesia Plan  ASA: 2  Anesthesia Plan: General   Post-op Pain Management: Minimal or no pain anticipated   Induction: Intravenous  PONV Risk Score and Plan: 3 and Propofol infusion, TIVA and Ondansetron  Airway Management Planned: Nasal Cannula  Additional Equipment: None  Intra-op Plan:   Post-operative Plan:   Informed Consent: I have  reviewed the patients History and Physical, chart, labs and discussed the procedure including the risks, benefits and alternatives for the proposed anesthesia with the patient or authorized representative who has indicated his/her understanding and acceptance.     Dental advisory given  Plan Discussed with: CRNA and Surgeon  Anesthesia Plan Comments: (Discussed risks of anesthesia with patient, including possibility of difficulty with spontaneous ventilation under anesthesia necessitating airway intervention, PONV, and rare risks such as cardiac or respiratory or neurological events, and allergic reactions. Discussed the role of CRNA in patient's perioperative care. Patient understands.)       Anesthesia Quick Evaluation

## 2022-11-06 NOTE — Anesthesia Postprocedure Evaluation (Signed)
Anesthesia Post Note  Patient: Michelle Johns  Procedure(s) Performed: COLONOSCOPY WITH PROPOFOL POLYPECTOMY  Patient location during evaluation: Endoscopy Anesthesia Type: General Level of consciousness: awake and alert Pain management: pain level controlled Vital Signs Assessment: post-procedure vital signs reviewed and stable Respiratory status: spontaneous breathing, nonlabored ventilation, respiratory function stable and patient connected to nasal cannula oxygen Cardiovascular status: blood pressure returned to baseline and stable Postop Assessment: no apparent nausea or vomiting Anesthetic complications: no   No notable events documented.   Last Vitals:  Vitals:   11/06/22 0849 11/06/22 1007  BP: 124/66 (!) 106/52  Pulse: 60 (!) 53  Resp: 20 15  Temp: (!) 35.6 C (!) 36.3 C  SpO2: 100% 100%    Last Pain:  Vitals:   11/06/22 1017  TempSrc:   PainSc: 0-No pain                 Corinda Gubler

## 2022-11-06 NOTE — Interval H&P Note (Signed)
History and Physical Interval Note: Preprocedure H&P from 11/06/22  was reviewed and there was no interval change after seeing and examining the patient.  Written consent was obtained from the patient after discussion of risks, benefits, and alternatives. Patient has consented to proceed with Colonoscopy with possible intervention   11/06/2022 9:10 AM  Barbera Setters  has presented today for surgery, with the diagnosis of 793.4 (ICD-9-CM) - R93.3 (ICD-10-CM) - Abnormal colonoscopy.  The various methods of treatment have been discussed with the patient and family. After consideration of risks, benefits and other options for treatment, the patient has consented to  Procedure(s): COLONOSCOPY WITH PROPOFOL (N/A) as a surgical intervention.  The patient's history has been reviewed, patient examined, no change in status, stable for surgery.  I have reviewed the patient's chart and labs.  Questions were answered to the patient's satisfaction.     Jaynie Collins

## 2022-11-06 NOTE — H&P (Signed)
Pre-Procedure H&P   Patient ID: Michelle Johns is a 77 y.o. female.  Gastroenterology Provider: Jaynie Collins, DO  Referring Provider: Tawni Pummel, PA PCP: Danella Penton, MD  Date: 11/06/2022  HPI Ms. Michelle Johns is a 77 y.o. female who presents today for Colonoscopy for Diverticular stricture, postsurgery.  Patient with severe diverticular disease attempted colonoscopy 12/15 and 02/02/2022 but unable to pass sigmoid stricturing.  She underwent sigmoid colectomy with colorectal anastomosis.  She underwent loop ileostomy takedown April 2024.  No dysplasia or malignancy  Since her procedure, she has done well with regular bowel movements no melena no hematochezia.  No other acute GI complaints   Past Medical History:  Diagnosis Date   Breast cancer (HCC) 2013   left breast cancer   Cancer of left breast (HCC)    left 2013 with chemo and radiation therapy   Combined hyperlipidemia    Hypothyroidism    Personal history of chemotherapy 2013   left breast ca   Personal history of radiation therapy 2013   LEFT lumpectomy w/ radiation    Past Surgical History:  Procedure Laterality Date   BREAST EXCISIONAL BIOPSY Left 2013   positive,2013 with chemo and radiation therapy   BREAST LUMPECTOMY Left 2013   w/ radiation and chemo, lymph node removal   COLON SURGERY  02/17/2022   ileostomy surgery   COLONOSCOPY N/A 01/20/2022   Procedure: COLONOSCOPY;  Surgeon: Jaynie Collins, DO;  Location: [facility] ENDOSCOPY;  Service: Gastroenterology;  Laterality: N/A;   COLONOSCOPY N/A 02/02/2022   Procedure: COLONOSCOPY;  Surgeon: Jaynie Collins, DO;  Location: [facility] ENDOSCOPY;  Service: Gastroenterology;  Laterality: N/A;   COLONOSCOPY WITH PROPOFOL N/A 09/21/2016   Procedure: COLONOSCOPY WITH PROPOFOL;  Surgeon: Christena Deem, MD;  Location: [facility] ENDOSCOPY;  Service: Endoscopy;  Laterality: N/A;   ESOPHAGOGASTRODUODENOSCOPY N/A 01/20/2022   Procedure:  ESOPHAGOGASTRODUODENOSCOPY (EGD);  Surgeon: Jaynie Collins, DO;  Location: [facility] ENDOSCOPY;  Service: Gastroenterology;  Laterality: N/A;   TUBAL LIGATION      Family History No h/o GI disease or malignancy  Review of Systems  Constitutional:  Negative for activity change, appetite change, chills, diaphoresis, fatigue, fever and unexpected weight change.  HENT:  Negative for trouble swallowing and voice change.   Respiratory:  Negative for shortness of breath and wheezing.   Cardiovascular:  Negative for chest pain, palpitations and leg swelling.  Gastrointestinal:  Negative for abdominal distention, abdominal pain, anal bleeding, blood in stool, constipation, diarrhea, nausea, rectal pain and vomiting.  Musculoskeletal:  Negative for arthralgias and myalgias.  Skin:  Negative for color change and pallor.  Neurological:  Negative for dizziness, syncope and weakness.  Psychiatric/Behavioral:  Negative for confusion.   All other systems reviewed and are negative.    Medications No current facility-administered medications on file prior to encounter.   Current Outpatient Medications on File Prior to Encounter  Medication Sig Dispense Refill   Cholecalciferol (VITAMIN D-1000 MAX ST) 1000 UNITS tablet Take 1,000 Units by mouth daily.      CRESTOR 10 MG tablet Take 10 mg by mouth daily.      Cyanocobalamin (VITAMIN B-12) 5000 MCG LOZG Take 2,500 mcg by mouth once a week.      Pertinent medications related to GI and procedure were reviewed by me with the patient prior to the procedure   Current Facility-Administered Medications:    0.9 %  sodium chloride infusion, , Intravenous, Continuous, Jaynie Collins, DO, Last Rate:  20 mL/hr at 11/06/22 0901, 20 mL/hr at 11/06/22 0901  sodium chloride 20 mL/hr (11/06/22 0901)       Allergies  Allergen Reactions   Other Other (See Comments)    Pt took herceptin, taxotere, and carboplatin 06/2011 and was in hospital for several days  with fever 104. Dr. Doylene Canning at the time did not know what chemo she had reaction to but was started on another treatment thereafter.   Allergies were reviewed by me prior to the procedure  Objective   Body mass index is 21.37 kg/m. Vitals:   11/06/22 0849  BP: 124/66  Pulse: 60  Resp: 20  Temp: (!) 96.1 F (35.6 C)  TempSrc: Temporal  SpO2: 100%  Weight: 58.2 kg  Height: 5\' 5"  (1.651 m)     Physical Exam Vitals and nursing note reviewed.  Constitutional:      General: She is not in acute distress.    Appearance: Normal appearance. She is not ill-appearing, toxic-appearing or diaphoretic.  HENT:     Head: Normocephalic and atraumatic.     Nose: Nose normal.     Mouth/Throat:     Mouth: Mucous membranes are moist.     Pharynx: Oropharynx is clear.  Eyes:     General: No scleral icterus.    Extraocular Movements: Extraocular movements intact.  Cardiovascular:     Rate and Rhythm: Normal rate and regular rhythm.     Heart sounds: Normal heart sounds. No murmur heard.    No friction rub. No gallop.  Pulmonary:     Effort: Pulmonary effort is normal. No respiratory distress.     Breath sounds: Normal breath sounds. No wheezing, rhonchi or rales.  Abdominal:     General: Bowel sounds are normal. There is no distension.     Palpations: Abdomen is soft.     Tenderness: There is no abdominal tenderness. There is no guarding or rebound.  Musculoskeletal:     Cervical back: Neck supple.     Right lower leg: No edema.     Left lower leg: No edema.  Skin:    General: Skin is warm and dry.     Coloration: Skin is not jaundiced or pale.  Neurological:     General: No focal deficit present.     Mental Status: She is alert and oriented to person, place, and time. Mental status is at baseline.  Psychiatric:        Mood and Affect: Mood normal.        Behavior: Behavior normal.        Thought Content: Thought content normal.        Judgment: Judgment normal.       Assessment:  Ms. Michelle Johns is a 77 y.o. female  who presents today for Colonoscopy for Diverticular stricture, postsurgery .  Plan:  Colonoscopy with possible intervention today  Colonoscopy with possible biopsy, control of bleeding, polypectomy, and interventions as necessary has been discussed with the patient/patient representative. Informed consent was obtained from the patient/patient representative after explaining the indication, nature, and risks of the procedure including but not limited to death, bleeding, perforation, missed neoplasm/lesions, cardiorespiratory compromise, and reaction to medications. Opportunity for questions was given and appropriate answers were provided. Patient/patient representative has verbalized understanding is amenable to undergoing the procedure.   Jaynie Collins, DO  [facility] Gastroenterology  Portions of the record may have been created with voice recognition software. Occasional wrong-word or 'sound-a-like' substitutions may have occurred due  to the inherent limitations of voice recognition software.  Read the chart carefully and recognize, using context, where substitutions may have occurred.

## 2022-11-07 ENCOUNTER — Encounter: Payer: Self-pay | Admitting: Gastroenterology

## 2022-11-07 LAB — SURGICAL PATHOLOGY

## 2022-11-28 ENCOUNTER — Other Ambulatory Visit: Payer: Self-pay | Admitting: Internal Medicine

## 2022-11-28 DIAGNOSIS — R911 Solitary pulmonary nodule: Secondary | ICD-10-CM

## 2022-12-01 ENCOUNTER — Other Ambulatory Visit: Payer: Medicare PPO

## 2022-12-01 ENCOUNTER — Ambulatory Visit: Payer: Medicare PPO | Admitting: Internal Medicine

## 2022-12-08 ENCOUNTER — Ambulatory Visit
Admission: RE | Admit: 2022-12-08 | Discharge: 2022-12-08 | Disposition: A | Discharge status: Home / Self Care | Payer: Medicare PPO | Source: Ambulatory Visit | Attending: Internal Medicine | Admitting: Internal Medicine

## 2022-12-08 DIAGNOSIS — R911 Solitary pulmonary nodule: Secondary | ICD-10-CM | POA: Insufficient documentation

## 2023-01-23 ENCOUNTER — Other Ambulatory Visit: Payer: Medicare PPO

## 2023-05-03 ENCOUNTER — Encounter: Payer: Self-pay | Admitting: Gastroenterology

## 2023-05-14 ENCOUNTER — Encounter: Payer: Self-pay | Admitting: Gastroenterology

## 2023-05-14 ENCOUNTER — Ambulatory Visit

## 2023-05-14 ENCOUNTER — Ambulatory Visit
Admission: RE | Admit: 2023-05-14 | Discharge: 2023-05-14 | Disposition: A | Discharge status: Home / Self Care | Attending: Gastroenterology | Admitting: Gastroenterology

## 2023-05-14 ENCOUNTER — Encounter
Admission: RE | Disposition: A | Discharge status: Home / Self Care | Payer: Self-pay | Source: Home / Self Care | Attending: Gastroenterology

## 2023-05-14 DIAGNOSIS — Z980 Intestinal bypass and anastomosis status: Secondary | ICD-10-CM | POA: Insufficient documentation

## 2023-05-14 DIAGNOSIS — Z860100 Personal history of colon polyps, unspecified: Secondary | ICD-10-CM | POA: Diagnosis present

## 2023-05-14 DIAGNOSIS — E039 Hypothyroidism, unspecified: Secondary | ICD-10-CM | POA: Insufficient documentation

## 2023-05-14 DIAGNOSIS — Z1211 Encounter for screening for malignant neoplasm of colon: Secondary | ICD-10-CM | POA: Insufficient documentation

## 2023-05-14 DIAGNOSIS — K5730 Diverticulosis of large intestine without perforation or abscess without bleeding: Secondary | ICD-10-CM | POA: Diagnosis not present

## 2023-05-14 HISTORY — PX: FLEXIBLE SIGMOIDOSCOPY: SHX5431

## 2023-05-14 HISTORY — DX: Deficiency of other specified B group vitamins: E53.8

## 2023-05-14 HISTORY — DX: Other specified diseases of intestine: K63.89

## 2023-05-14 HISTORY — DX: Solitary pulmonary nodule: R91.1

## 2023-05-14 HISTORY — DX: Other specified postprocedural states: Z98.890

## 2023-05-14 SURGERY — SIGMOIDOSCOPY, FLEXIBLE
Anesthesia: General

## 2023-05-14 MED ORDER — SODIUM CHLORIDE 0.9 % IV SOLN: INTRAVENOUS | Status: DC

## 2023-05-14 MED ORDER — PROPOFOL 10 MG/ML IV BOLUS
INTRAVENOUS | Status: DC | PRN
  Administered 2023-05-14: 70 mg via INTRAVENOUS
  Administered 2023-05-14 (×2): 20 mg via INTRAVENOUS

## 2023-05-14 MED ORDER — LIDOCAINE HCL (CARDIAC) PF 100 MG/5ML IV SOSY
PREFILLED_SYRINGE | INTRAVENOUS | Status: DC | PRN
  Administered 2023-05-14: 40 mg via INTRAVENOUS

## 2023-05-14 NOTE — Transfer of Care (Signed)
 Immediate Anesthesia Transfer of Care Note  Patient: Michelle Johns  Procedure(s) Performed: Arnell Sieving  Patient Location: PACU  Anesthesia Type:MAC  Level of Consciousness: drowsy  Airway & Oxygen Therapy: Patient Spontanous Breathing  Post-op Assessment: Report given to RN and Post -op Vital signs reviewed and stable  Post vital signs: Reviewed and stable  Last Vitals:  Vitals Value Taken Time  BP    Temp    Pulse    Resp    SpO2      Last Pain:  Vitals:   05/14/23 1502  TempSrc: Tympanic  PainSc: 0-No pain         Complications: No notable events documented.

## 2023-05-14 NOTE — Op Note (Signed)
 [facility] Gastroenterology Patient Name: Michelle Johns Procedure Date: 05/14/2023 3:31 PM MRN: [id number] Account #: [id number] Date of Birth: January 10, 1946 Admit Type: Outpatient Age: 78 Room: [facility] ENDO ROOM 2 Gender: Female Note Status: Finalized Instrument Name: Upper Endoscope [id number] Procedure:             Flexible Sigmoidoscopy Indications:           High risk colon cancer surveillance: Personal history                         of colonic polyps Providers:             Jaynie Collins DO, DO Medicines:             Monitored Anesthesia Care Complications:         No immediate complications. Estimated blood loss: None. Procedure:             Pre-Anesthesia Assessment:                        - Prior to the procedure, a History and Physical was                         performed, and patient medications and allergies were                         reviewed. The patient is competent. The risks and                         benefits of the procedure and the sedation options and                         risks were discussed with the patient. All questions                         were answered and informed consent was obtained.                         Patient identification and proposed procedure were                         verified by the physician, the nurse, the anesthetist                         and the technician in the endoscopy suite. Mental                         Status Examination: alert and oriented. Airway                         Examination: normal oropharyngeal airway and neck                         mobility. Respiratory Examination: clear to                         auscultation. CV Examination: RRR, no murmurs, no S3                         or  S4. Prophylactic Antibiotics: The patient does not                         require prophylactic antibiotics. Prior                         Anticoagulants: The patient has taken no anticoagulant                          or antiplatelet agents. ASA Grade Assessment: III - A                         patient with severe systemic disease. After reviewing                         the risks and benefits, the patient was deemed in                         satisfactory condition to undergo the procedure. The                         anesthesia plan was to use monitored anesthesia care                         (MAC). Immediately prior to administration of                         medications, the patient was re-assessed for adequacy                         to receive sedatives. The heart rate, respiratory                         rate, oxygen saturations, blood pressure, adequacy of                         pulmonary ventilation, and response to care were                         monitored throughout the procedure. The physical                         status of the patient was re-assessed after the                         procedure.                        After obtaining informed consent, the scope was passed                         under direct vision. The Endoscope was introduced                         through the anus and advanced to the the descending                         colon. The flexible sigmoidoscopy was accomplished  without difficulty. The patient tolerated the                         procedure well. The quality of the bowel preparation                         was adequate. Findings:      The perianal and digital rectal examinations were normal. Pertinent       negatives include normal sphincter tone.      There was evidence of a prior end-to-side colo-colonic anastomosis at 18       cm proximal to the anus. This was patent and was characterized by       healthy appearing mucosa and visible sutures. The anastomosis was       traversed. Estimated blood loss: none.      Multiple small-mouthed diverticula were found in the recto-sigmoid colon       and sigmoid colon. Estimated blood  loss: none.      Normal mucosa was found at 16 cm proximal to the anus.      Normal mucosa was found in the rectum, in the recto-sigmoid colon, in       the sigmoid colon and in the distal descending colon. Estimated blood       loss: none.      The exam was otherwise without abnormality. Impression:            - Patent end-to-side colo-colonic anastomosis,                         characterized by healthy appearing mucosa and visible                         sutures.                        - Diverticulosis in the recto-sigmoid colon and in the                         sigmoid colon.                        - Normal mucosa at 16 cm proximal to the anus.                        - Normal mucosa in the rectum, in the recto-sigmoid                         colon, in the sigmoid colon and in the distal                         descending colon.                        - The examination was otherwise normal.                        - No specimens collected. Recommendation:        - Patient has a contact number available for                         emergencies. The  signs and symptoms of potential                         delayed complications were discussed with the patient.                         Return to normal activities tomorrow. Written                         discharge instructions were provided to the patient.                        - Discharge patient to home.                        - Resume previous diet.                        - Continue present medications.                        - Return to GI office PRN.                        - The findings and recommendations were discussed with                         the patient. Procedure Code(s):     --- Professional ---                        [phone], Sigmoidoscopy, flexible; diagnostic, including                         collection of specimen(s) by brushing or washing, when                         performed (separate procedure) Diagnosis Code(s):      --- Professional ---                        Z86.010, Personal history of colonic polyps                        Z98.0, Intestinal bypass and anastomosis status                        K57.30, Diverticulosis of large intestine without                         perforation or abscess without bleeding CPT copyright 2022 American Medical Association. All rights reserved. The codes documented in this report are preliminary and upon coder review may  be revised to meet current compliance requirements. Attending Participation:      I personally performed the entire procedure. Elfredia Nevins, DO Jaynie Collins DO, DO 05/14/2023 3:55:50 PM This report has been signed electronically. Number of Addenda: 0 Note Initiated On: 05/14/2023 3:31 PM Total Procedure Duration: 0 hours 5 minutes 13 seconds  Estimated Blood Loss:  Estimated blood loss: none.       [facility]

## 2023-05-14 NOTE — Anesthesia Preprocedure Evaluation (Signed)
 Anesthesia Evaluation  Patient identified by MRN, date of birth, ID band Patient awake    Reviewed: Allergy & Precautions, H&P , NPO status , Patient's Chart, lab work & pertinent test results, reviewed documented beta blocker date and time   Airway Mallampati: II   Neck ROM: full    Dental  (+) Poor Dentition   Pulmonary neg pulmonary ROS   Pulmonary exam normal        Cardiovascular Exercise Tolerance: Good negative cardio ROS Normal cardiovascular exam Rhythm:regular Rate:Normal     Neuro/Psych negative neurological ROS  negative psych ROS   GI/Hepatic negative GI ROS, Neg liver ROS,,,  Endo/Other  Hypothyroidism    Renal/GU negative Renal ROS  negative genitourinary   Musculoskeletal   Abdominal   Peds  Hematology  (+) Blood dyscrasia, anemia   Anesthesia Other Findings Past Medical History: No date: B12 deficiency 2013: Breast cancer (HCC)     Comment:  left breast cancer No date: Cancer of left breast (HCC)     Comment:  left 2013 with chemo and radiation therapy No date: Colonic mass 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 No date: History of ileostomy No date: Hypothyroidism 2013: Personal history of chemotherapy     Comment:  left breast ca 2013: Personal history of radiation therapy     Comment:  LEFT lumpectomy w/ radiation No date: Pulmonary nodule Past Surgical History: 2013: BREAST EXCISIONAL BIOPSY; Left     Comment:  positive,2013 with chemo and radiation therapy 2013: BREAST LUMPECTOMY; Left     Comment:  w/ radiation and chemo, lymph node removal 02/17/2022: COLON SURGERY     Comment:  ileostomy surgery 01/20/2022: COLONOSCOPY; N/A     Comment:  Procedure: COLONOSCOPY;  Surgeon: Jaynie Collins,              DO;  Location: [facility] ENDOSCOPY;  Service:               Gastroenterology;  Laterality: N/A; 02/02/2022: COLONOSCOPY; N/A     Comment:  Procedure: COLONOSCOPY;  Surgeon: Jaynie Collins,              DO;  Location: [facility] ENDOSCOPY;  Service:               Gastroenterology;  Laterality: N/A; 09/21/2016: COLONOSCOPY WITH PROPOFOL; N/A     Comment:  Procedure: COLONOSCOPY WITH PROPOFOL;  Surgeon:               Christena Deem, MD;  Location: [facility] ENDOSCOPY;                Service: Endoscopy;  Laterality: N/A; 11/06/2022: COLONOSCOPY WITH PROPOFOL; N/A     Comment:  Procedure: COLONOSCOPY WITH PROPOFOL;  Surgeon: Jaynie Collins, DO;  Location: [facility] ENDOSCOPY;  Service:               Gastroenterology;  Laterality: N/A; 01/20/2022: ESOPHAGOGASTRODUODENOSCOPY; N/A     Comment:  Procedure: ESOPHAGOGASTRODUODENOSCOPY (EGD);  Surgeon:               Jaynie Collins, DO;  Location: [facility] ENDOSCOPY;                Service: Gastroenterology;  Laterality: N/A; No date: MASTECTOMY 11/06/2022: POLYPECTOMY     Comment:  Procedure: POLYPECTOMY;  Surgeon: Jaynie Collins,              DO;  Location:  [facility] ENDOSCOPY;  Service:               Gastroenterology;; 11/06/2022: SUBMUCOSAL LIFTING INJECTION     Comment:  Procedure: SUBMUCOSAL LIFTING INJECTION;  Surgeon:               Jaynie Collins, DO;  Location: [facility] ENDOSCOPY;                Service: Gastroenterology;; No date: TUBAL LIGATION No date: WISDOM TOOTH EXTRACTION BMI    Body Mass Index: 21.13 kg/m     Reproductive/Obstetrics negative OB ROS                             Anesthesia Physical Anesthesia Plan  ASA: 2  Anesthesia Plan: General   Post-op Pain Management:    Induction:   PONV Risk Score and Plan:   Airway Management Planned:   Additional Equipment:   Intra-op Plan:   Post-operative Plan:   Informed Consent: I have reviewed the patients History and Physical, chart, labs and discussed the procedure including the risks, benefits and alternatives for the proposed anesthesia with the patient or authorized representative who has  indicated his/her understanding and acceptance.     Dental Advisory Given  Plan Discussed with: CRNA  Anesthesia Plan Comments:        Anesthesia Quick Evaluation

## 2023-05-14 NOTE — Interval H&P Note (Signed)
 History and Physical Interval Note: Preprocedure H&P from 05/14/23  was reviewed and there was no interval change after seeing and examining the patient.  Written consent was obtained from the patient after discussion of risks, benefits, and alternatives. Patient has consented to proceed with Flexible Sigmoidoscopy with possible intervention   05/14/2023 3:36 PM  Michelle Johns  has presented today for surgery, with the diagnosis of R93.3 (ICD-10-CM) - Abnormal colonoscopy V12.72 (ICD-9-CM) - Z86.0100 (ICD-10-CM) - History of colon polyps.  The various methods of treatment have been discussed with the patient and family. After consideration of risks, benefits and other options for treatment, the patient has consented to  Procedure(s): SIGMOIDOSCOPY, FLEXIBLE (N/A) as a surgical intervention.  The patient's history has been reviewed, patient examined, no change in status, stable for surgery.  I have reviewed the patient's chart and labs.  Questions were answered to the patient's satisfaction.     Jaynie Collins

## 2023-05-14 NOTE — H&P (Signed)
 Pre-Procedure H&P   Patient ID: Michelle Johns is a 78 y.o. female.  Gastroenterology Provider: Jaynie Collins, DO  Referring Provider: Tawni Pummel, PA PCP: Danella Penton, MD  Date: 05/14/2023  HPI Ms. Michelle Johns is a 78 y.o. female who presents today for Flexible Sigmoidoscopy for Personal history of colon polyps .  Patient underwent colonoscopy after her colon resection and was found to have a polypoid area approximately 12 to 13 mm in size and approximately 16 cm from the anal verge near her anastomosis.  This was resected but reported as mucosal prolapse changes on pathology.  Undergoing flexible sigmoidoscopy to reevaluate the area today.   Past Medical History:  Diagnosis Date   B12 deficiency    Breast cancer (HCC) 2013   left breast cancer   Cancer of left breast (HCC)    left 2013 with chemo and radiation therapy   Colonic mass    Combined hyperlipidemia    History of ileostomy    Hypothyroidism    Personal history of chemotherapy 2013   left breast ca   Personal history of radiation therapy 2013   LEFT lumpectomy w/ radiation   Pulmonary nodule     Past Surgical History:  Procedure Laterality Date   BREAST EXCISIONAL BIOPSY Left 2013   positive,2013 with chemo and radiation therapy   BREAST LUMPECTOMY Left 2013   w/ radiation and chemo, lymph node removal   COLON SURGERY  02/17/2022   ileostomy surgery   COLONOSCOPY N/A 01/20/2022   Procedure: COLONOSCOPY;  Surgeon: Jaynie Collins, DO;  Location: [facility] ENDOSCOPY;  Service: Gastroenterology;  Laterality: N/A;   COLONOSCOPY N/A 02/02/2022   Procedure: COLONOSCOPY;  Surgeon: Jaynie Collins, DO;  Location: [facility] ENDOSCOPY;  Service: Gastroenterology;  Laterality: N/A;   COLONOSCOPY WITH PROPOFOL N/A 09/21/2016   Procedure: COLONOSCOPY WITH PROPOFOL;  Surgeon: Christena Deem, MD;  Location: [facility] ENDOSCOPY;  Service: Endoscopy;  Laterality: N/A;   COLONOSCOPY WITH PROPOFOL N/A  11/06/2022   Procedure: COLONOSCOPY WITH PROPOFOL;  Surgeon: Jaynie Collins, DO;  Location: [facility] ENDOSCOPY;  Service: Gastroenterology;  Laterality: N/A;   ESOPHAGOGASTRODUODENOSCOPY N/A 01/20/2022   Procedure: ESOPHAGOGASTRODUODENOSCOPY (EGD);  Surgeon: Jaynie Collins, DO;  Location: [facility] ENDOSCOPY;  Service: Gastroenterology;  Laterality: N/A;   MASTECTOMY     POLYPECTOMY  11/06/2022   Procedure: POLYPECTOMY;  Surgeon: Jaynie Collins, DO;  Location: [facility] ENDOSCOPY;  Service: Gastroenterology;;   Sunnie Nielsen LIFTING INJECTION  11/06/2022   Procedure: SUBMUCOSAL LIFTING INJECTION;  Surgeon: Jaynie Collins, DO;  Location: [facility] ENDOSCOPY;  Service: Gastroenterology;;   TUBAL LIGATION     WISDOM TOOTH EXTRACTION      Family History No h/o GI disease or malignancy  Review of Systems  Constitutional:  Negative for activity change, appetite change, chills, diaphoresis, fatigue, fever and unexpected weight change.  HENT:  Negative for trouble swallowing and voice change.   Respiratory:  Negative for shortness of breath and wheezing.   Cardiovascular:  Negative for chest pain, palpitations and leg swelling.  Gastrointestinal:  Negative for abdominal distention, abdominal pain, anal bleeding, blood in stool, constipation, diarrhea, nausea, rectal pain and vomiting.  Musculoskeletal:  Negative for arthralgias and myalgias.  Skin:  Negative for color change and pallor.  Neurological:  Negative for dizziness, syncope and weakness.  Psychiatric/Behavioral:  Negative for confusion.   All other systems reviewed and are negative.    Medications No current facility-administered medications on file prior to encounter.  Current Outpatient Medications on File Prior to Encounter  Medication Sig Dispense Refill   acetaminophen (TYLENOL) 500 MG tablet Take by mouth.     Cholecalciferol (VITAMIN D-1000 MAX ST) 1000 UNITS tablet Take 1,000 Units by mouth daily.      CRESTOR 10  MG tablet Take 10 mg by mouth daily.      Cyanocobalamin (VITAMIN B-12) 5000 MCG LOZG Take 2,500 mcg by mouth once a week.     levothyroxine (SYNTHROID) 88 MCG tablet Take by mouth.      Pertinent medications related to GI and procedure were reviewed by me with the patient prior to the procedure   Current Facility-Administered Medications:    0.9 %  sodium chloride infusion, , Intravenous, Continuous, Jaynie Collins, DO, Last Rate: 20 mL/hr at 05/14/23 1518, New Bag at 05/14/23 1518  sodium chloride 20 mL/hr at 05/14/23 1518       Allergies  Allergen Reactions   Other Other (See Comments)    Pt took herceptin, taxotere, and carboplatin 06/2011 and was in hospital for several days with fever 104. Dr. Doylene Canning at the time did not know what chemo she had reaction to but was started on another treatment thereafter.   Allergies were reviewed by me prior to the procedure  Objective   Body mass index is 21.13 kg/m. Vitals:   05/14/23 1502  BP: 109/69  Pulse: 69  Resp: 15  Temp: (!) 97 F (36.1 C)  TempSrc: Tympanic  SpO2: 100%  Weight: 57.6 kg  Height: 5\' 5"  (1.651 m)     Physical Exam Vitals and nursing note reviewed.  Constitutional:      General: She is not in acute distress.    Appearance: Normal appearance. She is not ill-appearing, toxic-appearing or diaphoretic.  HENT:     Head: Normocephalic and atraumatic.     Nose: Nose normal.     Mouth/Throat:     Mouth: Mucous membranes are moist.     Pharynx: Oropharynx is clear.  Eyes:     General: No scleral icterus.    Extraocular Movements: Extraocular movements intact.  Cardiovascular:     Rate and Rhythm: Normal rate and regular rhythm.     Heart sounds: Normal heart sounds. No murmur heard.    No friction rub. No gallop.  Pulmonary:     Effort: Pulmonary effort is normal. No respiratory distress.     Breath sounds: Normal breath sounds. No wheezing, rhonchi or rales.  Abdominal:     General: Bowel sounds  are normal. There is no distension.     Palpations: Abdomen is soft.     Tenderness: There is no abdominal tenderness. There is no guarding or rebound.  Musculoskeletal:     Cervical back: Neck supple.     Right lower leg: No edema.     Left lower leg: No edema.  Skin:    General: Skin is warm and dry.     Coloration: Skin is not jaundiced or pale.  Neurological:     General: No focal deficit present.     Mental Status: She is alert and oriented to person, place, and time. Mental status is at baseline.  Psychiatric:        Mood and Affect: Mood normal.        Behavior: Behavior normal.        Thought Content: Thought content normal.        Judgment: Judgment normal.      Assessment:  Ms.  Michelle Johns is a 78 y.o. female  who presents today for Flexible Sigmoidoscopy for Personal history of colon polyps .  Plan:  Flexible Sigmoidoscopy with possible intervention today  Flexible Sigmoidoscopy with possible biopsy, control of bleeding, polypectomy, and interventions as necessary has been discussed with the patient/patient representative. Informed consent was obtained from the patient/patient representative after explaining the indication, nature, and risks of the procedure including but not limited to death, bleeding, perforation, missed neoplasm/lesions, cardiorespiratory compromise, and reaction to medications. Opportunity for questions was given and appropriate answers were provided. Patient/patient representative has verbalized understanding is amenable to undergoing the procedure.   Jaynie Collins, DO  [facility] Gastroenterology  Portions of the record may have been created with voice recognition software. Occasional wrong-word or 'sound-a-like' substitutions may have occurred due to the inherent limitations of voice recognition software.  Read the chart carefully and recognize, using context, where substitutions may have occurred.

## 2023-05-15 ENCOUNTER — Encounter: Payer: Self-pay | Admitting: Gastroenterology

## 2023-05-22 NOTE — Anesthesia Postprocedure Evaluation (Signed)
 Anesthesia Post Note  Patient: Michelle Johns  Procedure(s) Performed: Marlynn Singer  Patient location during evaluation: PACU Anesthesia Type: General Level of consciousness: awake and alert Pain management: pain level controlled Vital Signs Assessment: post-procedure vital signs reviewed and stable Respiratory status: spontaneous breathing, nonlabored ventilation, respiratory function stable and patient connected to nasal cannula oxygen Cardiovascular status: blood pressure returned to baseline and stable Postop Assessment: no apparent nausea or vomiting Anesthetic complications: no   No notable events documented.   Last Vitals:  Vitals:   05/14/23 1607 05/14/23 1614  BP: (!) 116/58 (!) 126/59  Pulse: (!) 53 (!) 50  Resp: 13 11  Temp:    SpO2: 90% 100%    Last Pain:  Vitals:   05/15/23 0730  TempSrc:   PainSc: 0-No pain                 Zula Hitch

## 2023-07-24 ENCOUNTER — Other Ambulatory Visit

## 2023-07-24 ENCOUNTER — Ambulatory Visit: Payer: Medicare PPO | Admitting: Internal Medicine

## 2023-07-24 ENCOUNTER — Ambulatory Visit: Admitting: Oncology

## 2023-07-24 ENCOUNTER — Other Ambulatory Visit: Payer: Medicare PPO

## 2023-07-30 ENCOUNTER — Other Ambulatory Visit: Payer: Self-pay | Admitting: Internal Medicine

## 2023-07-30 DIAGNOSIS — Z1231 Encounter for screening mammogram for malignant neoplasm of breast: Secondary | ICD-10-CM

## 2023-08-07 ENCOUNTER — Ambulatory Visit
Admission: RE | Admit: 2023-08-07 | Discharge: 2023-08-07 | Disposition: A | Discharge status: Home / Self Care | Source: Ambulatory Visit | Attending: Internal Medicine | Admitting: Internal Medicine

## 2023-08-07 DIAGNOSIS — Z1231 Encounter for screening mammogram for malignant neoplasm of breast: Secondary | ICD-10-CM | POA: Diagnosis present

## 2023-09-27 ENCOUNTER — Ambulatory Visit: Payer: Medicare PPO | Admitting: Dermatology

## 2023-10-08 ENCOUNTER — Ambulatory Visit: Payer: Medicare PPO | Admitting: Dermatology

## 2023-10-09 ENCOUNTER — Ambulatory Visit: Payer: Medicare PPO | Admitting: Dermatology

## 2023-10-09 DIAGNOSIS — L578 Other skin changes due to chronic exposure to nonionizing radiation: Secondary | ICD-10-CM

## 2023-10-09 DIAGNOSIS — L821 Other seborrheic keratosis: Secondary | ICD-10-CM

## 2023-10-09 DIAGNOSIS — W908XXA Exposure to other nonionizing radiation, initial encounter: Secondary | ICD-10-CM | POA: Diagnosis not present

## 2023-10-09 DIAGNOSIS — L719 Rosacea, unspecified: Secondary | ICD-10-CM

## 2023-10-09 DIAGNOSIS — D692 Other nonthrombocytopenic purpura: Secondary | ICD-10-CM

## 2023-10-09 DIAGNOSIS — L814 Other melanin hyperpigmentation: Secondary | ICD-10-CM

## 2023-10-09 MED ORDER — METRONIDAZOLE 0.75 % EX CREA: TOPICAL_CREAM | CUTANEOUS | 11 refills | Status: AC

## 2023-10-09 NOTE — Progress Notes (Signed)
   New Patient Visit   Subjective  Michelle Johns is a 78 y.o. female who presents for the following: The patient would like to establish care and meet the doctor.  The patient had a skin check by another dermatologist in May 02, 2023 with no lesions of concern. No history of skin cancer. She has a few spots she would like to point out today, and get scheduled next year for a total-body skin exam.   The following portions of the chart were reviewed this encounter and updated as appropriate: medications, allergies, medical history  Review of Systems:  No other skin or systemic complaints except as noted in HPI or Assessment and Plan.  Objective  Well appearing patient in no apparent distress; mood and affect are within normal limits.  A focused examination was performed of the following areas: Face, arms, back, chest  Relevant physical exam findings are noted in the Assessment and Plan.    Assessment & Plan  ACTINIC DAMAGE - chronic, secondary to cumulative UV radiation exposure/sun exposure over time - diffuse scaly erythematous macules with underlying dyspigmentation - Recommend daily broad spectrum sunscreen SPF 30+ to sun-exposed areas, reapply every 2 hours as needed.  - Recommend staying in the shade or wearing long sleeves, sun glasses (UVA+UVB protection) and wide brim hats (4-inch brim around the entire circumference of the hat). - Call for new or changing lesions.  LENTIGINES Exam: scattered tan macules Due to sun exposure Treatment Plan: Benign-appearing, observe. Recommend daily broad spectrum sunscreen SPF 30+ to sun-exposed areas, reapply every 2 hours as needed.  Call for any changes  SEBORRHEIC KERATOSIS - Stuck-on, waxy, tan-brown papules and/or plaques  - Benign-appearing - Discussed benign etiology and prognosis. - Observe - Call for any changes  Purpura - Chronic; persistent and recurrent.  Treatable, but not curable. 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  Chronic and persistent condition with duration or expected duration over one year. Condition is symptomatic/ bothersome to patient. Not currently at goal.   Rosacea is a chronic progressive skin condition usually affecting the face of adults, causing redness and/or acne bumps. It is treatable but not curable. It sometimes affects the eyes (ocular rosacea) as well. It may respond to topical and/or systemic medication and can flare with stress, sun exposure, alcohol, exercise, topical steroids (including hydrocortisone/cortisone 10) and some foods.  Daily application of broad spectrum spf 30+ sunscreen to face is recommended to reduce flares.  Treatment Plan Start metronidazole  0.75% cream apply to aa face at bedtime dsp 45g 1 yr Rf.    Return 6-12 months, for TBSE.  IAndrea Kerns, CMA, am acting as scribe for Rexene Rattler, MD .   Documentation: I have reviewed the above documentation for accuracy and completeness, and I agree with the above.  Rexene Rattler, MD

## 2023-10-09 NOTE — Patient Instructions (Addendum)
 Rosacea  What is rosacea? Rosacea (say: ro-zay-sha) is a common skin disease that usually begins as a trend of flushing or blushing easily.  As rosacea progresses, a persistent redness in the center of the face will develop and may gradually spread beyond the nose and cheeks to the forehead and chin.  In some cases, the ears, chest, and back could be affected.  Rosacea may appear as tiny blood vessels or small red bumps that occur in crops.  Frequently they can contain pus, and are called "pustules".  If the bumps do not contain pus, they are referred to as "papules".  Rarely, in prolonged, untreated cases of rosacea, the oil glands of the nose and cheeks may become permanently enlarged.  This is called rhinophyma, and is seen more frequently in men.  Signs and Risks In its beginning stages, rosacea tends to come and go, which makes it difficult to recognize.  It can start as intermittent flushing of the face.  Eventually, blood vessels may become permanently visible.  Pustules and papules can appear, but can be mistaken for adult acne.  People of all races, ages, genders and ethnic groups are at risk of developing rosacea.  However, it is more common in women (especially around menopause) and adults with fair skin between the ages of 20 and 47.  Treatment Dermatologists typically recommend a combination of treatments to effectively manage rosacea.  Treatment can improve symptoms and may stop the progression of the rosacea.  Treatment may involve both topical and oral medications.  The tetracycline antibiotics are often used for their anti-inflammatory effect; however, because of the possibility of developing antibiotic resistance, they should not be used long term at full dose.  For dilated blood vessels the options include electrodessication (uses electric current through a small needle), laser treatment, and cosmetics to hide the redness.   With all forms of treatment, improvement is a slow process, and  patients may not see any results for the first 3-4 weeks.  It is very important to avoid the sun and other triggers.  Patients must wear sunscreen daily.  Skin Care Instructions: Cleanse the skin with a mild soap such as CeraVe cleanser, Cetaphil cleanser, or Dove soap once or twice daily as needed. Moisturize with Eucerin Redness Relief Daily Perfecting Lotion (has a subtle green tint), CeraVe Moisturizing Cream, or Oil of Olay Daily Moisturizer with sunscreen every morning and/or night as recommended. Makeup should be "non-comedogenic" (won't clog pores) and be labeled "for sensitive skin". Good choices for cosmetics are: Neutrogena, Almay, and Physician's Formula.  Any product with a green tint tends to offset a red complexion. If your eyes are dry and irritated, use artificial tears 2-3 times per day and cleanse the eyelids daily with baby shampoo.  Have your eyes examined at least every 2 years.  Be sure to tell your eye doctor that you have rosacea. Alcoholic beverages tend to cause flushing of the skin, and may make rosacea worse. Always wear sunscreen, protect your skin from extreme hot and cold temperatures, and avoid spicy foods, hot drinks, and mechanical irritation such as rubbing, scrubbing, or massaging the face.  Avoid harsh skin cleansers, cleansing masks, astringents, and exfoliation. If a particular product burns or makes your face feel tight, then it is likely to flare your rosacea. If you are having difficulty finding a sunscreen that you can tolerate, you may try switching to a chemical-free sunscreen.  These are ones whose active ingredient is zinc oxide or titanium dioxide  only.  They should also be fragrance free, non-comedogenic, and labeled for sensitive skin. Rosacea triggers may vary from person to person.  There are a variety of foods that have been reported to trigger rosacea.  Some patients find that keeping a diary of what they were doing when they flared helps them avoid  triggers.   Seborrheic Keratosis  What causes seborrheic keratoses? Seborrheic keratoses are harmless, common skin growths that first appear during adult life.  As time goes by, more growths appear.  Some people may develop a large number of them.  Seborrheic keratoses appear on both covered and uncovered body parts.  They are not caused by sunlight.  The tendency to develop seborrheic keratoses can be inherited.  They vary in color from skin-colored to gray, brown, or even black.  They can be either smooth or have a rough, warty surface.   Seborrheic keratoses are superficial and look as if they were stuck on the skin.  Under the microscope this type of keratosis looks like layers upon layers of skin.  That is why at times the top layer may seem to fall off, but the rest of the growth remains and re-grows.    Treatment Seborrheic keratoses do not need to be treated, but can easily be removed in the office.  Seborrheic keratoses often cause symptoms when they rub on clothing or jewelry.  Lesions can be in the way of shaving.  If they become inflamed, they can cause itching, soreness, or burning.  Removal of a seborrheic keratosis can be accomplished by freezing, burning, or surgery. If any spot bleeds, scabs, or grows rapidly, please return to have it checked, as these can be an indication of a skin cancer.   Due to recent changes in healthcare laws, you may see results of your pathology and/or laboratory studies on MyChart before the doctors have had a chance to review them. We understand that in some cases there may be results that are confusing or concerning to you. Please understand that not all results are received at the same time and often the doctors may need to interpret multiple results in order to provide you with the best plan of care or course of treatment. Therefore, we ask that you please give us  2 business days to thoroughly review all your results before contacting the office for  clarification. Should we see a critical lab result, you will be contacted sooner.   If You Need Anything After Your Visit  If you have any questions or concerns for your doctor, please call our main line at [phone] and press option 4 to reach your doctor's medical assistant. If no one answers, please leave a voicemail as directed and we will return your call as soon as possible. Messages left after 4 pm will be answered the following business day.   You may also send us  a message via MyChart. We typically respond to MyChart messages within 1-2 business days.  For prescription refills, please ask your pharmacy to contact our office. Our fax number is [phone].  If you have an urgent issue when the clinic is closed that cannot wait until the next business day, you can page your doctor at the number below.    Please note that while we do our best to be available for urgent issues outside of office hours, we are not available 24/7.   If you have an urgent issue and are unable to reach us , you may choose to seek medical care  at your doctor's office, retail clinic, urgent care center, or emergency room.  If you have a medical emergency, please immediately call 911 or go to the emergency department.  Pager Numbers  - Dr. Hester: [phone]  - Dr. Jackquline: [phone]  - Dr. Claudene: [phone]   - Dr. Raymund: [phone]  In the event of inclement weather, please call our main line at [phone] for an update on the status of any delays or closures.  Dermatology Medication Tips: Please keep the boxes that topical medications come in in order to help keep track of the instructions about where and how to use these. Pharmacies typically print the medication instructions only on the boxes and not directly on the medication tubes.   If your medication is too expensive, please contact our office at [phone] option 4 or send us  a message through MyChart.   We are unable to  tell what your co-pay for medications will be in advance as this is different depending on your insurance coverage. However, we may be able to find a substitute medication at lower cost or fill out paperwork to get insurance to cover a needed medication.   If a prior authorization is required to get your medication covered by your insurance company, please allow us  1-2 business days to complete this process.  Drug prices often vary depending on where the prescription is filled and some pharmacies may offer cheaper prices.  The website www.goodrx.com contains coupons for medications through different pharmacies. The prices here do not account for what the cost may be with help from insurance (it may be cheaper with your insurance), but the website can give you the price if you did not use any insurance.  - You can print the associated coupon and take it with your prescription to the pharmacy.  - You may also stop by our office during regular business hours and pick up a GoodRx coupon card.  - If you need your prescription sent electronically to a different pharmacy, notify our office through [facility] or by phone at [phone] option 4.     Si Usted Necesita Algo Despus de Su Visita  Tambin puede enviarnos un mensaje a travs de Clinical cytogeneticist. Por lo general respondemos a los mensajes de MyChart en el transcurso de 1 a 2 das hbiles.  Para renovar recetas, por favor pida a su farmacia que se ponga en contacto con nuestra oficina. Randi lakes de fax es [place] [phone].  Si tiene un asunto urgente cuando la clnica est cerrada y que no puede esperar hasta el siguiente da hbil, puede llamar/localizar a su doctor(a) al nmero que aparece a continuacin.   Por favor, tenga en cuenta que aunque hacemos todo lo posible para estar disponibles para asuntos urgentes fuera del horario de [place], no estamos disponibles las 24 horas del da, los 7 [address]  [address] de la [place].   Si tiene un problema  urgente y no puede comunicarse con nosotros, puede optar por buscar atencin mdica  en el consultorio de su doctor(a), en una clnica privada, en un centro de atencin urgente o en una sala de emergencias.  Si tiene Engineer, drilling, por favor llame inmediatamente al 911 o vaya a la sala de emergencias.  Nmeros de bper  - Dr. Hester: [phone]  - Dra. Jackquline: [phone]  - Dr. Claudene: [phone]  - Dra. Kitts: [phone]  En caso de inclemencias del [place], por favor llame a nuestra lnea principal al [phone] para una actualizacin sobre el [place]  de cualquier retraso o cierre.  Consejos para la medicacin en dermatologa: Por favor, guarde las cajas en las que vienen los medicamentos de uso tpico para ayudarle a seguir las instrucciones sobre dnde y cmo usarlos. Las farmacias generalmente imprimen las instrucciones del medicamento slo en las cajas y no directamente en los tubos del [place].   Si su medicamento es muy caro, por favor, pngase en contacto con landry rieger llamando al [phone] y presione la opcin 4 o envenos un mensaje a travs de Clinical cytogeneticist.   No podemos decirle cul ser su copago por los medicamentos por adelantado ya que esto es diferente dependiendo de la cobertura de su seguro. Sin embargo, es posible que podamos encontrar un medicamento sustituto a Audiological scientist un formulario para que el seguro cubra el medicamento que se considera necesario.   Si se requiere una autorizacin previa para que su compaa de seguros malta su medicamento, por favor permtanos de 1 a 2 das hbiles para completar este proceso.  Los precios de los medicamentos varan con frecuencia dependiendo del Environmental consultant de dnde se surte la receta y alguna farmacias pueden ofrecer precios ms baratos.  El sitio web www.goodrx.com tiene cupones para medicamentos de Health and safety inspector. Los precios aqu no tienen en cuenta lo que podra costar con la ayuda del  seguro (puede ser ms barato con su seguro), pero el sitio web puede darle el precio si no utiliz Tourist information centre manager.  - Puede imprimir el cupn correspondiente y llevarlo con su receta a la farmacia.  - Tambin puede pasar por nuestra oficina durante el horario de atencin regular y Education officer, museum una tarjeta de cupones de GoodRx.  - Si necesita que su receta se enve electrnicamente a una farmacia diferente, informe a nuestra oficina a travs de MyChart de [place] o por telfono llamando al [phone] y presione la opcin 4.

## 2023-10-15 NOTE — Addendum Note (Signed)
 Addended by: JACKQULINE SAWYER on: 10/15/2023 08:31 AM   Modules accepted: Level of Service

## 2024-06-17 ENCOUNTER — Encounter: Admitting: Dermatology
